# Patient Record
Sex: Female | Born: 1970 | Race: White | Hispanic: Yes | Marital: Single | Smoking: Light tobacco smoker
Health system: Western US, Academic
[De-identification: ages and names within clinical notes are randomized; demographics above are authoritative.]

## PROBLEM LIST (undated history)

## (undated) DIAGNOSIS — E119 Type 2 diabetes mellitus without complications: Secondary | ICD-10-CM

## (undated) DIAGNOSIS — F32A Depression, unspecified: Secondary | ICD-10-CM

## (undated) DIAGNOSIS — F419 Anxiety disorder, unspecified: Secondary | ICD-10-CM

## (undated) DIAGNOSIS — I872 Venous insufficiency (chronic) (peripheral): Secondary | ICD-10-CM

## (undated) DIAGNOSIS — E669 Obesity, unspecified: Secondary | ICD-10-CM

## (undated) DIAGNOSIS — I10 Essential (primary) hypertension: Secondary | ICD-10-CM

## (undated) DIAGNOSIS — Z794 Long term (current) use of insulin: Secondary | ICD-10-CM

## (undated) DIAGNOSIS — J45909 Unspecified asthma, uncomplicated: Secondary | ICD-10-CM

## (undated) DIAGNOSIS — K760 Fatty (change of) liver, not elsewhere classified: Secondary | ICD-10-CM

## (undated) DIAGNOSIS — Z0000 Encounter for general adult medical examination without abnormal findings: Principal | ICD-10-CM

## (undated) DIAGNOSIS — J309 Allergic rhinitis, unspecified: Secondary | ICD-10-CM

## (undated) DIAGNOSIS — L918 Other hypertrophic disorders of the skin: Secondary | ICD-10-CM

## (undated) DIAGNOSIS — F329 Major depressive disorder, single episode, unspecified: Secondary | ICD-10-CM

## (undated) DIAGNOSIS — N912 Amenorrhea, unspecified: Secondary | ICD-10-CM

## (undated) DIAGNOSIS — M79671 Pain in right foot: Secondary | ICD-10-CM

## (undated) DIAGNOSIS — Z716 Tobacco abuse counseling: Secondary | ICD-10-CM

## (undated) DIAGNOSIS — E785 Hyperlipidemia, unspecified: Secondary | ICD-10-CM

## (undated) HISTORY — DX: Other hypertrophic disorders of the skin: L91.8

## (undated) HISTORY — DX: Obesity, unspecified: E66.9

## (undated) HISTORY — DX: Tobacco abuse counseling: Z71.6

## (undated) HISTORY — DX: Type 2 diabetes mellitus without complications (CMS-HCC): E11.9

## (undated) HISTORY — DX: Unspecified asthma, uncomplicated: J45.909

## (undated) HISTORY — DX: Hyperlipidemia, unspecified: E78.5

## (undated) HISTORY — DX: Amenorrhea, unspecified: N91.2

## (undated) HISTORY — DX: Depression, unspecified: F32.A

## (undated) HISTORY — PX: NO PAST SURGERIES: SHX2092

## (undated) HISTORY — DX: Long term (current) use of insulin (CMS-HCC): Z79.4

## (undated) HISTORY — DX: Essential (primary) hypertension: I10

## (undated) HISTORY — DX: Allergic rhinitis, unspecified: J30.9

## (undated) HISTORY — DX: Major depressive disorder, single episode, unspecified: F32.9

## (undated) HISTORY — DX: Pain in right foot: M79.671

## (undated) HISTORY — DX: Encounter for general adult medical examination without abnormal findings: Z00.00

## (undated) HISTORY — DX: Fatty (change of) liver, not elsewhere classified: K76.0

## (undated) HISTORY — DX: Venous insufficiency (chronic) (peripheral): I87.2

## (undated) HISTORY — DX: Anxiety disorder, unspecified: F41.9

## (undated) MED ORDER — SIMVASTATIN 40 MG OR TABS
40.00 mg | ORAL_TABLET | Freq: Every evening | ORAL | 0 refills | Status: AC
Start: 2019-08-31 — End: ?

## (undated) MED ORDER — METFORMIN HCL 1000 MG OR TABS
1000.00 mg | ORAL_TABLET | Freq: Two times a day (BID) | ORAL | 0 refills | Status: AC
Start: 2019-04-28 — End: ?

## (undated) MED ORDER — SIMVASTATIN 40 MG OR TABS
40.00 mg | ORAL_TABLET | Freq: Every evening | ORAL | 0 refills | Status: AC
Start: 2019-04-28 — End: ?

## (undated) MED ORDER — ATENOLOL 25 MG OR TABS
ORAL_TABLET | ORAL | 0 refills | Status: AC
Start: 2018-09-12 — End: ?

## (undated) MED ORDER — METFORMIN HCL 1000 MG OR TABS
1000.00 mg | ORAL_TABLET | Freq: Two times a day (BID) | ORAL | 0 refills | Status: AC
Start: 2019-04-27 — End: ?

## (undated) MED ORDER — METFORMIN HCL 1000 MG OR TABS
ORAL_TABLET | ORAL | 0 refills | Status: AC
Start: 2018-09-12 — End: ?

## (undated) MED ORDER — LISINOPRIL-HYDROCHLOROTHIAZIDE 20-12.5 MG OR TABS
ORAL_TABLET | ORAL | 0 refills | Status: AC
Start: 2018-09-12 — End: ?

---

## 2004-09-27 ENCOUNTER — Other Ambulatory Visit (INDEPENDENT_AMBULATORY_CARE_PROVIDER_SITE_OTHER): Payer: Self-pay | Admitting: Emergency Medicine

## 2004-09-27 ENCOUNTER — Emergency Department (HOSPITAL_BASED_OUTPATIENT_CLINIC_OR_DEPARTMENT_OTHER): Admitting: Emergency Medicine

## 2004-09-27 LAB — BASIC METABOLIC PANEL, BLOOD
BUN: 7 mg/dL — ABNORMAL LOW (ref 8–18)
Bicarbonate: 28 meq/L (ref 24–31)
Calcium: 8.8 mg/dL (ref 8.8–10.3)
Chloride: 100 meq/L (ref 97–107)
Creatinine: 0.6 mg/dL (ref 0.5–1.5)
Glucose: 146 mg/dL — ABNORMAL HIGH (ref 65–110)
Potassium: 4.2 meq/L (ref 3.5–5.0)
Sodium: 138 meq/L (ref 135–145)

## 2004-11-26 NOTE — ED Notes (Addendum)
============================== ADMIT SUMMARY ==============================    RECEIVING NURSE -      ED NURSE -     +------------------------------- ALLERGIES -------------------------------+   NKDA    +-------------------------- ADMITTING DIAGNOSIS --------------------------+   htn   obesity   chronic ha    +--------------------------- ADMITTING SERVICE ---------------------------+    +------------------------ MOST RECENT VITAL SIGNS ------------------------+  BP - 129/74 PULSE - 82   RESPIRATIONS - 14 O2 SAT - 99   TEMPERATURE - MODE -   GCS TOTAL -   PAIN - PAIN QUALITY -   PAIN LOCATION -     +-------------------------------- FLUIDS ---------------------------------+  DATE TIME IV FLUID L/R LOCATION SIZE HUNG ABSORBED  ---------------------------------------------------------------------------     TOTAL IV: 0 ml    TOTAL OUTPUT: 0 ml TOTAL PO: 0 ml    +------------------------------ MEDICATIONS ------------------------------+  DATE TIME MEDICATION VERIFYING RN RN INIT  ---------------------------------------------------------------------------    10/29 1305 IBUPROFEN 600 Milligrams PO-(TAB) HDG    +------------------------------- LABS DONE -------------------------------+  ACT- MD MD RN AP +INITIALS+  IVE DATE TIME TIME TIME TREATMENT ORDERS MD RN AP   ---------------------------------------------------------------------------     10/29 1127 1128 1136 CHEM 7 + [place](BASIC META PANEL) LGL klc EXE   Specimen Type: Blood    EKG DONE - NO    +---------------------------- PROCEDURE NOTES ----------------------------+    +------------------------ OTHER NURSING PROCEDURES -----------------------+     +--------------------------- PSYCHOSOCIAL NEEDS --------------------------+  +------------------------- BARRIERS TO LEARNING --------------------------+    ASSESSMENT- Assessment Done with Findings of:      BARRIERS-    No Barriers  SUPPORT PERSON-      SPECIAL CONSIDERATIONS-      ============================== TRIAGE  RECORD ==============================    CHIEF COMPLAINT- High Blood Pressure    TIME OF ONSET- N/A : +-STANDING-+ +--SEATED--+  TRIAGE CATEGORY- 2 : BP PULSE BP PULSE   ROOM- 11A : N/A/N/A N/A 172/76 81   MODE OF ARRIVAL- Car :   IN CUSTODY- No : TEMP MODE O2SAT RESP LMP   PRIVATE MD- no : 97.4 Oral N/A 16 N/A       :   WORK RELATED INJURY- No : +--GCS--+ +--PUPILS--+  RETURN IN 72 HOURS- None : E V M TOT L R RESPONSE  TRIAGE NURSE- Veryl Speak : 4 5 6 15  X X N/A     IS THIS VISIT RELATED TO ASSAULT OR DOMESTIC VIOLENCE- no   PAIN TYPE- V NOW- 6 TOLERABLE AT- 4 QUALITY- Constant      PAIN LOCATION- head RADIATES TO- no   LATEX ALLERGY FORM- No LATEX ALLERGY- No TETANUS- N/A   IMMUNIZATION- N/A PED HEIGHT- N/A WEIGHT- N/A KG  ADDITIONAL FORMS- No   +------------------------- CARE PRIOR TO ARRIVAL -------------------------+   no  +------------------------------- ALLERGIES -------------------------------+   NKDA  +------------------------------ MEDICATIONS ------------------------------+   None  +-------------------------- PAST MEDICAL HISTORY -------------------------+   denies  +---------------------------- CURRENT HISTORY ----------------------------+   pt worried that BP is high, states she's under alot of stress, has gained   alot of wgt, ankles are swollen, pain in R neck and back of head. also has   R leg pain    =========================== REASSESSMENT VITALS ===========================   R T M I   E E O N   S M D O2 PUPILS +---GCS----+ I  DATE TIME BP HR P P E SAT L R E V M TOT POSITION T  ---------------------------------------------------------------------------    10/29 1031 172/76 81 16 97.4 O 4 5 6 15  Seated  KCS  10/29 1031 / Standing KCS  10/29 1200 129/74 82 14 99 Lying HDG     +-----------------PAIN-----------------+   T       Y N T       P O O      DATE TIME E W L LOCATION QUALITY RADIATES COMMENT      ---------------------------------------------------------------------------    10/29 1031 V 6 4  head Constant no Triage  10/29 1031 Triage  10/29 1200     ============================= NURSE DISCHARGE =============================    DISCHARGE NURSE- Heather Glass   DISPOSITION- Seen & Treated WITH- By Self   ACCOMPANIED BY- N/A   EQUIP W/TRANSPORT-    N/A   TIME OF DISPOSITION- 09/27/2004 1348 LEFT ED VIA- [facility]   TEANSFERRED TO- N/A REASON- N/A   ADMITTED TO- N/A ROOM- N/A   NURSE REPORT TO- N/A REPORT TIME- N/A   BELONGINGS- With Patient ENVELOPE NUMBER- N/A   CONDITION ON DISCHARGE- Stable   AFTERCARE PROVIDED WITH- Written and Verbal   WHAT AFTERCARE INSTRUCTIONS WERE GIVEN AND REVIEWED WITH PATIENT  AND/OR FAMILY?- (see EPIC instructions)   HA, HTN   IN WHAT LANGUAGE WERE THESE GIVEN?- English OTHER: N/A   TRANSLATED BY- N/A OTHER: N/A   GCS- E: 4 V: 5 M: 6 TOTAL: 15   PAIN LEVEL UPON DISPOSITION- 4 OUT OF 10  EXPECTED OUTCOMES MET- Yes   WHAT MEDS WERE PROVIDED FROM DISCHARGE PYXIS?-    N/A   RX TO BE FILLED FOR- N/A   DID THE PATIENT OR RESPONSIBLE CARE PROVIDER UNDERSTAND THE FOLLOW UP  RECOMMENDATION?- Yes DISPOSITION BY- RN        ============================== POINT OF CARE ==============================    OCCULT BLOOD STOOL RESULTS   Norm results neg.  DATE TIME RESULTS DONE BY CONTROL POSTIVE CONTROL NEGATIVE     URINE PREGNANCY TEST   Norm results for non pregnant females neg.  DATE TIME RESULTS DONE BY   10/29 1104 Neg KPR     URINE DIP   Norm results - All neg. with pH 4.6 to 8.0 and urobili 0.1 to 1.0   LEUKO NI- PRO- GLU- URO-   DATE TIME CYTE TRITE PH TEIN COSE KETONES BILI BILI BLOOD BY   10/29 1049 n n 5 n n n n n n KPR    FINGER STICK GLUCOSE   Norm results 60 to 110 mg/dl  DATE TIME RESULTS DONE BY     FINGER STICK HEMOGLOBIN   Norm results adult female 56 to 17 gm/dl  Norm results adult female 23 to 16 gm/dl  DATE TIME RESULTS DONE BY     ============================== MD NOTES H&P ===============================    TIME OF NOTE WRITTEN- N/A      CHIEF COMPLAINT- N/A        HISTORY  OF PRESENT ILLNESS  10/31 1018 Dorthy Cooler, MD     pt is a 33 y/o fem who has multiple c/o today. 1. has had wt   gain over past 6months 2.intermittent "stress headaches"   and 3. "i think my bp is high". pt says she's under alot of   stress, has gained alot of wgt this year, maybe ankles are   swollen, and notes pain in R lat neck, tight muscles,   radiating up to back of head. when asked to pinp0oint reason   for visit she sayd i just want my bp checked. ha not worst   of life, no n/v/photophobia. no stiff  neck, no blurred   vsion. does not have pmd      PAST MEDICAL/SURGICAL HISTORY  10/31 1018 Dorthy Cooler, MD     htn? mentioned by someone in past   obesity      FAMILY HISTORY- dad died of aneurysm in his 32s, htn   SOCIAL HISTORY- denies e/t/d abuse   OTHER- dad died of aneurysm in his 5s, htn   REVIEW OF SYSTEMS- All other systems reviewed and are negative.     PHYSICAL EXAM  10/31 1019 Dorthy Cooler, MD     mod systolic htn, o/w nl vs    10/31 1159 Dorthy Cooler, MD     normal O2 saturation on room air   Gen: obese, NAD, fluid speech   HEENT: ncat, perrla, anicteric sclerae, mmm   Neck: supple, no LAD, no midline ttp, JVP flat   Back: no CVAT, no midline ttp    Lungs: ctab, no w/r/r    CV: rrr, no m/r/g    Abdo: soft, ntnd, nabs, no masses    Ext: no edema, DP 2+ b/l   Neuro: a&o x4, cn 2-12 grossly intact. Nonfocal      IMPRESSION  10/31 1159 Dorthy Cooler, MD     mod htn possible, rec 3 measurements on 3 separated days at   same pmd/clinic   renal fxn nl   tension ha mild      MEDICAL DECISION MAKING  10/31 1159 Dorthy Cooler, MD     baseline labs wnl

## 2004-11-26 NOTE — ED Notes (Addendum)
==============================   ATTENDING NOTE =============================    10/29 1214 Danielle Glassman, MD Attending     pt seen with resident      cc. htn   hpi. pt presents for eval of persistently high BP. reports   mild right sided headache, back, and leg pain for quite some   time.    pmh/med/allergies confirmed   gen nad, obese   heent eomi perrl op clear   neck supple   resp ctab   cvs rrr, no mrg   abd +bs soft nt   ext no edema   impression   hypertensive   headache and back pain but no concerning features   no acute issues   plan   check chem and ua   pt will need out pt w/u for htn   says she will have insurance in several weeks and would   prefer to have full w/u when that occurs   f/u in clinic to have BP rechecked and perhaps get started   on meds

## 2004-11-26 NOTE — ED Notes (Addendum)
=================================   ORDERS ==================================    ACT- MD MD RN AP +INITIALS+  IVE DATE TIME TIME TIME TREATMENT ORDERS MD RN AP   ---------------------------------------------------------------------------     10/29 1042 1054 1044 POC Urine Dip LGL HDG [facility]   10/29 1043 1054 1044 pls repeat vs thanks LGL HDG [facility]   10/29 1043 1054 1044 POC UPT LGL HDG [facility]   10/29 1127 1128 1136 CHEM 7 + [place](BASIC META PANEL) LGL klc EXE   Specimen Type: Blood   10/29 1229 1346 1233 Discharge LGL HDG AXM   10/29 1302 1304 1303 IBUPROFEN 600 Milligrams PO-(TAB) LGL HDG RXG

## 2004-11-26 NOTE — ED Notes (Addendum)
rec f/u pmd/clinic (provided multiple numbers)   strict rted precuaitons for worsening sx    CASE PRESENTED TO- Danielle [name]     ============================= PHYSICIAN NOTES =============================      ============================= PROCEDURE NOTES =============================      ================================ LAB NOTES ================================    10/29 1228 Danielle Cooler, MD     nl chem      ================================= IMAGES ==================================      ============================== MD DISCHARGE ===============================    DISCHARGE PHYSICIAN- Danielle [name]   CHIEF COMPLAINT- N/A CASE PRESENTED TO- Danielle [name]   CONDITION OF DISCHARGE- Improving   WAS THIS VISIT FOR A WORK RELATED ILLNESS OR INJURY- No      PRIMARY CARE PHYSICIAN- NONE PER PT   HAS PCP BEEN CONTACTED- No H&P NOTE WAS DICTATED- No   DISCHARGE DIAGNOSIS-    htn obesity chronic ha     DISCHARGE INSTRUCTIONS    10/29 1334 Danielle Cooler, MD     PHYSICIAN- Danielle Glassman, MD Attending FOLLOW-UP (DAYS)- N/A    APPOINTMENT- No RETURN TO- N/A LANGUAGE- English    INSTRUCTIONS-   HYPERTENSION (HIGH BLOOD PRESSURE)    NONSPECIFIC HEADACHE    MEDICATIONS-   REFERRAL CLINICS-   [facility]    [facility] [place] [facility]    [facility] ([place]    [facility] [place] [facility]    [facility] [address]    REFERRAL PHYSICIANS-   ADDITIONAL INSTRUCTIONS-   please see a doctor next week for follow up   discuss with them your family history   if anything worsens, come right back   good luck      ============================= FOLLOW UP NOTES =============================    12/28 11/26/2004 1029 Danielle Patricia, RN     Reason for Addendum or Follow Up: Addendum Note   Action Taken: None required   Late Entry- 1130- chem 7 drawn and sent to the lab.

## 2018-05-18 ENCOUNTER — Telehealth (INDEPENDENT_AMBULATORY_CARE_PROVIDER_SITE_OTHER): Payer: Self-pay | Admitting: Family Practice

## 2018-05-18 NOTE — Telephone Encounter (Signed)
FYI    New Patient Pre-Visit Planning    Mychart offered: yes  [facility]  Establish Visit Date: 05/20/2018    Agenda Setting:    1) establish care   2) diabetes, cholestorol   3) medication refills    Preferred Pharmacy:   No Pharmacies Listed  [facility]   [address]   [place], [place] [id number]

## 2018-05-18 NOTE — Telephone Encounter (Signed)
Review.

## 2018-05-20 ENCOUNTER — Ambulatory Visit (INDEPENDENT_AMBULATORY_CARE_PROVIDER_SITE_OTHER): Payer: No Typology Code available for payment source | Admitting: Family Practice

## 2018-05-20 ENCOUNTER — Encounter (INDEPENDENT_AMBULATORY_CARE_PROVIDER_SITE_OTHER): Payer: Self-pay

## 2018-05-20 ENCOUNTER — Encounter (INDEPENDENT_AMBULATORY_CARE_PROVIDER_SITE_OTHER): Payer: Self-pay | Admitting: Family Practice

## 2018-05-20 ENCOUNTER — Other Ambulatory Visit: Payer: Self-pay

## 2018-05-20 ENCOUNTER — Other Ambulatory Visit (INDEPENDENT_AMBULATORY_CARE_PROVIDER_SITE_OTHER): Payer: No Typology Code available for payment source | Attending: Family Practice

## 2018-05-20 VITALS — BP 158/90 | HR 67 | Temp 97.9°F | Resp 17 | Ht 70.0 in | Wt 263.0 lb

## 2018-05-20 DIAGNOSIS — J309 Allergic rhinitis, unspecified: Secondary | ICD-10-CM | POA: Insufficient documentation

## 2018-05-20 DIAGNOSIS — E119 Type 2 diabetes mellitus without complications: Secondary | ICD-10-CM

## 2018-05-20 DIAGNOSIS — M79671 Pain in right foot: Secondary | ICD-10-CM

## 2018-05-20 DIAGNOSIS — Z1331 Encounter for screening for depression: Secondary | ICD-10-CM

## 2018-05-20 DIAGNOSIS — Z794 Long term (current) use of insulin: Secondary | ICD-10-CM

## 2018-05-20 DIAGNOSIS — E785 Hyperlipidemia, unspecified: Secondary | ICD-10-CM

## 2018-05-20 DIAGNOSIS — Z1389 Encounter for screening for other disorder: Secondary | ICD-10-CM

## 2018-05-20 DIAGNOSIS — Z0000 Encounter for general adult medical examination without abnormal findings: Secondary | ICD-10-CM

## 2018-05-20 DIAGNOSIS — F419 Anxiety disorder, unspecified: Secondary | ICD-10-CM

## 2018-05-20 DIAGNOSIS — K760 Fatty (change of) liver, not elsewhere classified: Secondary | ICD-10-CM

## 2018-05-20 DIAGNOSIS — L918 Other hypertrophic disorders of the skin: Secondary | ICD-10-CM | POA: Insufficient documentation

## 2018-05-20 DIAGNOSIS — N912 Amenorrhea, unspecified: Secondary | ICD-10-CM

## 2018-05-20 DIAGNOSIS — I10 Essential (primary) hypertension: Secondary | ICD-10-CM | POA: Insufficient documentation

## 2018-05-20 DIAGNOSIS — Z716 Tobacco abuse counseling: Secondary | ICD-10-CM

## 2018-05-20 DIAGNOSIS — Z6837 Body mass index (BMI) 37.0-37.9, adult: Secondary | ICD-10-CM

## 2018-05-20 DIAGNOSIS — J45909 Unspecified asthma, uncomplicated: Secondary | ICD-10-CM | POA: Insufficient documentation

## 2018-05-20 DIAGNOSIS — E669 Obesity, unspecified: Secondary | ICD-10-CM

## 2018-05-20 DIAGNOSIS — I872 Venous insufficiency (chronic) (peripheral): Secondary | ICD-10-CM

## 2018-05-20 DIAGNOSIS — F32A Depression, unspecified: Secondary | ICD-10-CM | POA: Insufficient documentation

## 2018-05-20 HISTORY — DX: Amenorrhea, unspecified: N91.2

## 2018-05-20 HISTORY — DX: Encounter for general adult medical examination without abnormal findings: Z00.00

## 2018-05-20 HISTORY — DX: Essential (primary) hypertension: I10

## 2018-05-20 HISTORY — DX: Pain in right foot: M79.671

## 2018-05-20 HISTORY — DX: Obesity, unspecified: E66.9

## 2018-05-20 HISTORY — DX: Fatty (change of) liver, not elsewhere classified: K76.0

## 2018-05-20 HISTORY — DX: Tobacco abuse counseling: Z71.6

## 2018-05-20 HISTORY — DX: Type 2 diabetes mellitus without complications: E11.9

## 2018-05-20 HISTORY — DX: Hyperlipidemia, unspecified: E78.5

## 2018-05-20 HISTORY — DX: Long term (current) use of insulin (CMS-HCC): Z79.4

## 2018-05-20 LAB — TSH, BLOOD: TSH: 2.73 u[IU]/mL (ref 0.27–4.20)

## 2018-05-20 LAB — CBC WITH DIFF, BLOOD
ANC-Automated: 4.3 10*3/uL (ref 1.6–7.0)
Abs Basophils: 0.1 10*3/uL (ref <0.1)
Abs Eosinophils: 0.5 10*3/uL (ref 0.1–0.5)
Abs Lymphs: 3.3 10*3/uL — ABNORMAL HIGH (ref 0.8–3.1)
Abs Monos: 0.8 10*3/uL (ref 0.2–0.8)
Basophils: 1 %
Eosinophils: 5 %
Hct: 42.4 % (ref 34.0–45.0)
Hgb: 14 gm/dL (ref 11.2–15.7)
Imm Gran Abs: 0.1 10*3/uL (ref <0.1)
Lymphocytes: 37 %
MCH: 31.8 pg (ref 26.0–32.0)
MCHC: 33 g/dL (ref 32.0–36.0)
MCV: 96.4 um3 — ABNORMAL HIGH (ref 79.0–95.0)
MPV: 10.5 fL (ref 9.4–12.4)
Monocytes: 9 %
Plt Count: 239 10*3/uL (ref 140–370)
RBC: 4.4 10*6/uL (ref 3.90–5.20)
RDW: 13.9 % (ref 12.0–14.0)
Segs: 48 %
WBC: 9 10*3/uL (ref 4.0–10.0)

## 2018-05-20 LAB — COMPREHENSIVE METABOLIC PANEL, BLOOD
ALT (SGPT): 46 U/L — ABNORMAL HIGH (ref 0–33)
AST (SGOT): 40 U/L — ABNORMAL HIGH (ref 0–32)
Albumin: 4 g/dL (ref 3.5–5.2)
Alkaline Phos: 66 U/L (ref 35–140)
Anion Gap: 12 mmol/L (ref 7–15)
BUN: 10 mg/dL (ref 6–20)
Bicarbonate: 26 mmol/L (ref 22–29)
Bilirubin, Tot: 0.36 mg/dL (ref <1.2)
Calcium: 9.3 mg/dL (ref 8.5–10.6)
Chloride: 104 mmol/L (ref 98–107)
Creatinine: 0.77 mg/dL (ref 0.51–0.95)
GFR: >60 mL/min
Glucose: 85 mg/dL (ref 70–99)
Potassium: 4.1 mmol/L (ref 3.5–5.1)
Sodium: 142 mmol/L (ref 136–145)
Total Protein: 7.3 g/dL (ref 6.0–8.0)

## 2018-05-20 LAB — LIPID(CHOL FRACT) PANEL, BLOOD
Cholesterol: 201 mg/dL (ref <200)
HDL-Cholesterol: 53 mg/dL
LDL-Chol (Calc): 127 mg/dL (ref <160)
Non-HDL Cholesterol: 148 mg/dL
Triglycerides: 107 mg/dL (ref 10–170)

## 2018-05-20 LAB — MAGNESIUM, BLOOD: Magnesium: 2 mg/dL (ref 1.6–2.6)

## 2018-05-20 LAB — GLYCOSYLATED HGB(A1C), BLOOD: Glyco Hgb (A1C): 6.8 % — ABNORMAL HIGH (ref 4.8–5.8)

## 2018-05-20 LAB — PROLACTIN, BLOOD: Prolactin: 10 ng/mL

## 2018-05-20 MED ORDER — METFORMIN HCL 500 MG OR TABS
ORAL_TABLET | ORAL | Status: DC
Start: 2018-01-10 — End: 2018-05-20

## 2018-05-20 MED ORDER — ONETOUCH DELICA LANCETS FINE MISC
Status: AC
Start: 2015-03-18 — End: 2019-03-17

## 2018-05-20 MED ORDER — FAMOTIDINE 40 MG OR TABS
ORAL_TABLET | ORAL | Status: DC
Start: 2018-01-11 — End: 2018-07-07

## 2018-05-20 MED ORDER — SIMVASTATIN 20 MG OR TABS
ORAL_TABLET | ORAL | Status: DC
Start: 2017-06-28 — End: 2018-05-20

## 2018-05-20 MED ORDER — LISINOPRIL-HYDROCHLOROTHIAZIDE 20-12.5 MG OR TABS
2.00 | ORAL_TABLET | Freq: Every day | ORAL | 0 refills | Status: DC
Start: 2018-05-20 — End: 2018-08-17

## 2018-05-20 MED ORDER — METOPROLOL SUCCINATE 25 MG OR TB24
25.00 mg | ORAL_TABLET | Freq: Every day | ORAL | 0 refills | Status: DC
Start: 2018-05-20 — End: 2018-08-15

## 2018-05-20 MED ORDER — BUPROPION XL (DAILY) 150 MG OR TB24
150.00 mg | ORAL_TABLET | Freq: Every day | ORAL | 8 refills | Status: DC
Start: 2018-05-20 — End: 2019-06-22

## 2018-05-20 MED ORDER — IBUPROFEN 400 MG OR TABS
ORAL_TABLET | ORAL | Status: AC
Start: 2018-01-29 — End: 2020-01-29

## 2018-05-20 MED ORDER — METFORMIN HCL 1000 MG OR TABS
1000.00 mg | ORAL_TABLET | Freq: Two times a day (BID) | ORAL | 0 refills | Status: DC
Start: 2018-05-20 — End: 2018-08-17

## 2018-05-20 MED ORDER — INSULIN ISOPHANE & REGULAR (70-30) 100 UNIT/ML SC SUSP
50.00 [IU] | Freq: Two times a day (BID) | SUBCUTANEOUS | 3 refills | Status: AC
Start: 2018-05-20 — End: 2018-08-18

## 2018-05-20 MED ORDER — BUPROPION XL (DAILY) 300 MG OR TB24
ORAL_TABLET | ORAL | Status: DC
Start: 2018-01-29 — End: 2018-05-20

## 2018-05-20 MED ORDER — SIMVASTATIN 20 MG OR TABS
20.00 mg | ORAL_TABLET | Freq: Every evening | ORAL | 0 refills | Status: DC
Start: 2018-05-20 — End: 2018-07-07

## 2018-05-20 MED ORDER — ATENOLOL 25 MG OR TABS
ORAL_TABLET | ORAL | Status: DC
Start: 2018-01-10 — End: 2018-05-20

## 2018-05-20 MED ORDER — GLUCOSE BLOOD VI STRP
ORAL_STRIP | Status: AC
Start: 2015-03-18 — End: 2019-03-17

## 2018-05-20 MED ORDER — INSULIN ISOPHANE & REGULAR (70-30) 100 UNIT/ML SC SUSP
SUBCUTANEOUS | Status: DC
Start: 2018-01-29 — End: 2018-05-20

## 2018-05-20 MED ORDER — LISINOPRIL-HYDROCHLOROTHIAZIDE 20-12.5 MG OR TABS
ORAL_TABLET | ORAL | Status: DC
Start: 2018-01-10 — End: 2018-05-20

## 2018-05-20 MED ORDER — FREESTYLE LIBRE 14 DAY SENSOR MISC
11 refills | Status: AC
Start: 2018-05-20 — End: ?

## 2018-05-20 MED ORDER — FREESTYLE LIBRE 14 DAY READER DEVI
0 refills | Status: AC
Start: 2018-05-20 — End: ?

## 2018-05-20 NOTE — Assessment & Plan Note (Signed)
Posterior. Stable but significant. No podiatry eval.She would be interested in surgery. Appears to be a cyst or callus. Refer podiatry for further eval. Strict return precautions given.

## 2018-05-20 NOTE — Assessment & Plan Note (Addendum)
Not at goal today.  Asymptomatic.   Ran out of lisinopril-hctz 20/12.5, 2 tabs daily 1 week ago  Has continued atenolol 25 mg daily  Does not check Home BPs.  Refill lisinopril-hctz  20/12.5, 2 tabs daily  Switch atenolol to metoprolol 25 mg XL daily  BP/HR log TID for at least 1 week and bring at f/u  Healthy diet  Encourage inc exc  Refer bariatric surgery dept

## 2018-05-20 NOTE — Assessment & Plan Note (Signed)
LP normal 09/2017  Ran out of simvastatin 2 weeks ago.  Refill simvastatin 20 mg PO qhs  Check FLP   Healthy diet  Encourage inc exc  Refer bariatric surgery dept

## 2018-05-20 NOTE — Patient Instructions (Signed)
Check your blood pressure and heart rate three times per day and bring the log to me in 2 weeks.

## 2018-05-20 NOTE — Interdisciplinary (Signed)
Blood drawn from right arm with 21 gauge needle. 4 tubes taken.   Patient identity authenticated by Pamala MIller.

## 2018-05-20 NOTE — Assessment & Plan Note (Signed)
2012 after a relationship ended.  Tried 450 mg XL-nightmares.  She has been on 300 mg XL since.   Wants to taper as feeling fine.   Has not helped with smoking cessation.   No prior hosp, SI/HI/attempts, e/d abuse.  Will try wellbutrin 150 mg XL daily.   Declines therapy.   Close f/u.

## 2018-05-20 NOTE — Progress Notes (Signed)
[facility], [facility] OUTPATIENT PROGRESS NOTE       PATIENT:  Danielle [name]  MRN:  [id number]  DOB:  1970/12/18  DATE OF SERVICE:  05/20/2018  PRIMARY CARE PROVIDER: Domingo Cocking  TODAY'S PROVIDER: Domingo Cocking, DO  CC   Establish Care; Medication Review; Diabetes; and Lipids      HPI   Danielle [name] is a 47 year old female who  has a past medical history of Allergic rhinitis, Amenorrhea (05/20/2018), Anxiety and depression, Asthma, Chronic venous insufficiency, Encounter for tobacco use cessation counseling (05/20/2018), Fatty liver (05/20/2018), Hyperlipidemia, unspecified hyperlipidemia type (05/20/2018), Hypertension, unspecified type (05/20/2018), Intractable right heel pain (05/20/2018), Obesity, unspecified classification, unspecified obesity type, unspecified whether serious comorbidity present (05/20/2018), Routine health maintenance (05/20/2018), Skin tags, multiple acquired, and Type 2 diabetes mellitus without complication, with long-term current use of insulin (CMS-HCC) (05/20/2018). and presents for the following:      [facility]-last visit approx Nov 2018   New  Establish care  While since she saw PCP    Highlighted paper with what she wants to discuss    Med refills for 90 days if possible    DM II since 2007   a1c 7 04/07/2017 (6.9 06/20/14); a1c 7.3 10/25/17 UACR neg   Metformin 500 BID-has 1 week left 04/2014: B12 529 05/2015   NPH 55 units qam, 55 qhs-adjusts according to goal: actually 40-50 BID -using novilin-today last dose   Monofilament testing always normal   DM eye screen-thinks 2018-eye exams have always been normal, due    No complications she is aware of     HTN  Elevated   Ran out of lisinopril-hctz 20/12.5 daily, 2 tabs daily; 1 week ago; atenolol 25 daily-has for another week   No home BPs  Vitals:    05/20/18 1440 05/20/18 1528   BP: (!) 160/91 158/90   BP Location: Left arm Left arm   BP Patient Position: Sitting Sitting   BP cuff size: Regular Large   Pulse: 67    Resp: 17     Temp: 97.9 F (36.6 C)    TempSrc: Oral    Weight: 119.3 kg (263 lb)    Height: 5\' 10"  (1.778 m)      HL-simva 20 -ran out 2 weeks     GERD- pepcid 40 bid, bentyl 20 tid-doesn't really use this, has some     Amenorrhea x 2 yrs  Prev advised overweight, uncontrolled dm  If moves around, feels sweaty but no hot flashes  Advised could take prog if wanted to but she never filled rx  No prior US   Sexually active with same partner for 1o yrs   G0     Dep/anxiety  wellbutrin 300 XL daily (450 caused nightmares)  Went through bad break up in 2012 and that is when she started it and hasn't gotten used to it  Feels fine now  Trying to wean off for a while  Has a bottle of 150s right now-not sure if expired-will order today   No prior hosp  No si/hi or attempts     Tob use  Started 10 yrs ago  1 pack per week  Always this way  Only when drinks alcohol  Hasn't tried gum/patches;   Quit line didn't help-few times     Body mass index is 37.74 kg/m.  Interested in surgery  Still interested in this  Diet-likes some junk foods, but not binging; biggest issue is  inactivity   exc-takes dog out twice per day around block  Mainly because CVI and has R heel bone spur that is hurting-stable, but causes a lot of pain-hasn't seen a foot doctor -would be interested in podiatry eval     Fatty liver, non alcoholic  GI f/u  US 2015: liver 21.59 cm, echogenicity increased, no hepatic masses, normal flow in main portal vein  Rest normal     RHM:  Pap 03/16/16  mammo today, no prior     04/07/17:  CBC w/ diff, BMP wnl  TSH 2.49  TC 164 Trig 138 HDL 57 LDL 79   UACR 23.4  ALT 63, AST 59  UA neg    [facility] [place] [facility]     ROS   (Check if Normal, or note + findings):   [x]  14-point ROS performed and is negative except as per HPI.    []  Not Obtainable:   []  Constit  []  Skin    []  Eyes  []  CV   []  MS    []  ENT  []  GI  []  Heme/Lymph     []  Resp  []  GU  []  Neuro    []  Imm/All   []  Endo  []  Psych     ALL   No Known Allergies  MEDS     Current  Outpatient Medications   Medication Sig Dispense Refill    buPROPion (WELLBUTRIN XL) 150 MG XL tablet Take 1 tablet (150 mg) by mouth daily. 90 tablet 8    Continuous Blood Gluc Receiver (CONTINUOUS BLOOD GLUCOSE, FREESTYLE LIBRE, 14 DAY READER) Use TID to read glucose topically. 1 Device 0    Continuous Blood Gluc Sensor (CONTINUOUS BLOOD GLUCOSE, FREESTYLE LIBRE 14 DAY, 14-DAY SENSOR) Insert subcutaneously every 14 days. 2 each 11    famotidine (PEPCID) 40 MG tablet Take 1 tablet by mouth 2 times a day      glucose blood (ONETOUCH VERIO) test strip by Does not apply route.      ibuprofen (MOTRIN) 400 MG tablet Take 1 to 2 tablets by mouth 3 times a day as needed for pain      insulin NPH-insulin regular (HUMULIN 70/30) (70-30) 100 UNIT/ML injection Inject 50 Units under the skin 2 times daily. 10 mL 3    lisinopril-hydrochlorothiazide (ZESTORETIC) 20-12.5 MG tablet Take 2 tablets by mouth daily. 180 tablet 0    metFORMIN (GLUCOPHAGE) 1000 MG tablet Take 1 tablet (1,000 mg) by mouth 2 times daily (with meals). 180 tablet 0    metoprolol succinate (TOPROL XL) 25 MG XL tablet Take 1 tablet (25 mg) by mouth daily. 90 tablet 0    ONETOUCH DELICA LANCETS FINE MISC by Does not apply route.      simvastatin (ZOCOR) 20 MG tablet Take 1 tablet (20 mg) by mouth every evening. 90 tablet 0     No current facility-administered medications for this visit.      PMH     Past Medical History:   Diagnosis Date    Allergic rhinitis     Amenorrhea 05/20/2018    Anxiety and depression     Asthma     Chronic venous insufficiency     Encounter for tobacco use cessation counseling 05/20/2018    Fatty liver 05/20/2018    Hyperlipidemia, unspecified hyperlipidemia type 05/20/2018    Hypertension, unspecified type 05/20/2018    Intractable right heel pain 05/20/2018    Obesity, unspecified classification, unspecified obesity type, unspecified whether serious comorbidity  present 05/20/2018    Routine health maintenance  05/20/2018    Skin tags, multiple acquired     Type 2 diabetes mellitus without complication, with long-term current use of insulin (CMS-HCC) 05/20/2018     PSxH     Past Surgical History:   Procedure Laterality Date    NO PAST SURGERIES       [facility] AND [facility]     Family History   Problem Relation Name Age of Onset    Diabetes Mother      No Known Problems Father      Diabetes Maternal Grandmother      No Known Problems Maternal Grandfather      No Known Problems Paternal Grandmother      No Known Problems Paternal Grandfather       Social History     Socioeconomic History    Marital status: Single     Spouse name: Not on file    Number of children: Not on file    Years of education: Not on file    Highest education level: Not on file   Occupational History    Not on file   Tobacco Use    Smoking status: Light Tobacco Smoker     Years: 10.00    Smokeless tobacco: Never Used    Tobacco comment: About 1 pack a week, only when drinking alcohol    Substance and Sexual Activity    Alcohol use: Yes     Frequency: 2-4 times a month    Drug use: Not Currently    Sexual activity: Yes     Partners: Male     Comment: same female partner x 10 yrs    Social Activities of Daily Living Present    Not on file   Social History Narrative    Not on file     PHYSICAL EXAM   Check if Normal, or note + findings  Vit BP 158/90 (BP Location: Left arm, BP Patient Position: Sitting, BP cuff size: Large)    Pulse 67    Temp 97.9 F (36.6 C) (Oral)    Resp 17    Ht 5\' 10"  (1.778 m)    Wt 119.3 kg (263 lb)    BMI 37.74 kg/m    Vitals:    05/20/18 1440 05/20/18 1528   BP: (!) 160/91 158/90   BP Location: Left arm Left arm   BP Patient Position: Sitting Sitting   BP cuff size: Regular Large   Pulse: 67    Resp: 17    Temp: 97.9 F (36.6 C)    TempSrc: Oral    Weight: 119.3 kg (263 lb)    Height: 5\' 10"  (1.778 m)       Gen [x]  NAD, obese  GI []  Abd  []  Rect []  HSM   Eyes [x]  Conj/Lids []  Pupils  []  Masses []  Guard/Rebnd   ENT []   Ears/otosc    []  Oroph   []  Nasal Muc  [x]  Hearing: grossly intact  GU:Fem  ZO:XWRU []  Ext   []  Vag              []  Pen  []  Scro    []  Cerv   []  Uter/Ad:    []  Deferred    []  Prost []  Deferred   Neck []  Inspect/Palp   []  Thyroid Neuro [x]  A&O [x]  CN2-12: grossly intact    Breast []  Inspect   []  Palpation  []  DTR [x]  Motor/Sen   Resp [  x] Effort [x]  Ascult []  Percuss MSK [x]  Gait   [x]  ROM  [x]  Tone  [x]  Bal [x]  Insp/palp: normal monofilament foot exam bilaterally; TTP over R posterior heel-may be callus vs cyst   []  Back   CV [x]  Auscultate [x]  Carotids       [x]  Edema Puls: [x]  Pedal []  Fem  Skin [x]  Inspect -multiple skin tags-neck    B foot/ankle hyperpigmentatio [x]  Palp   Lymph []  Neck  []  Axillary  []   Femoral Psych [x]  Insight/judg  [x]  Affect [x]  Cog     LABS/STUDIES     Orders Only on 09/27/2004   Component Date Value Ref Range Status    Glucose 09/27/2004 146* 65 - 110 mg/dL Final    BUN 16/08/9603 7* 8 - 18 mg/dL Final    Creatinine 54/07/8118 0.6  0.5 - 1.5 mg/dL Final    Sodium 14/78/2956 138  135 - 145 mEq/L Final    Potassium 09/27/2004 4.2  3.5 - 5.0 mEq/L Final    Chloride 09/27/2004 100  97 - 107 mEq/L Final    Bicarbonate 09/27/2004 28  24 - 31 mEq/L Final    Calcium 09/27/2004 8.8  8.8 - 10.3 mg/dL Final       Lab Results   Component Value Date    NA 138 09/27/2004    K 4.2 09/27/2004    CL 100 09/27/2004    BUN 7 (L) 09/27/2004    CREAT 0.6 09/27/2004        Pertinent [facility] Labs/imaging  personally reviewed by me     Assessment and Plan:   Danielle [name] is a very pleasant 47 year old female   has a past medical history of Allergic rhinitis, Amenorrhea (05/20/2018), Anxiety and depression, Asthma, Chronic venous insufficiency, Encounter for tobacco use cessation counseling (05/20/2018), Fatty liver (05/20/2018), Hyperlipidemia, unspecified hyperlipidemia type (05/20/2018), Hypertension, unspecified type (05/20/2018), Intractable right heel pain (05/20/2018), Obesity, unspecified  classification, unspecified obesity type, unspecified whether serious comorbidity present (05/20/2018), Routine health maintenance (05/20/2018), Skin tags, multiple acquired, and Type 2 diabetes mellitus without complication, with long-term current use of insulin (CMS-HCC) (05/20/2018)., new, establish care, discuss:       Problem List Items Addressed This Visit        Cardiovascular    Hyperlipidemia, unspecified hyperlipidemia type     LP normal 09/2017  Ran out of simvastatin 2 weeks ago.  Refill simvastatin 20 mg PO qhs  Check FLP   Healthy diet  Encourage inc exc  Refer bariatric surgery dept            Relevant Medications    simvastatin (ZOCOR) 20 MG tablet    Hypertension, unspecified type     Not at goal today.  Asymptomatic.   Ran out of lisinopril-hctz 20/12.5, 2 tabs daily 1 week ago  Has continued atenolol 25 mg daily  Does not check Home BPs.  Refill lisinopril-hctz  20/12.5, 2 tabs daily  Switch atenolol to metoprolol 25 mg XL daily  BP/HR log TID for at least 1 week and bring at f/u  Healthy diet  Encourage inc exc  Refer bariatric surgery dept             Relevant Medications    lisinopril-hydrochlorothiazide (ZESTORETIC) 20-12.5 MG tablet    metoprolol succinate (TOPROL XL) 25 MG XL tablet    Other Relevant Orders    Magnesium, Blood Green Plasma Separator Tube    Chronic venous insufficiency     B  feet  Discuss further at f/u               GI and Hepatology    Fatty liver     Non-alcoholic. Last [place] 2015: liver 21.59 cm, echogenicity increased, no hepatic masses, normal flow in main portal vein. LFTs: ALT 86/AST 80 10/25/17, normal CBC 10/25/17. Discuss hepatitis panel, ferritin at f/u. Order Abd [place].          Relevant Orders    US Abdomen Complete       Endocrine    Type 2 diabetes mellitus without complication, with long-term current use of insulin (CMS-HCC)     Dx 2007.  10/25/17 Labs:  A1C 7.3 (6.9 2015-->7 03/2017)  UACR neg   CBC wnl  BMP wnl  LFTs: ALT 86/AST 80  TSH wnl  LP wnl  B12 wnl  03/2015  Recheck CBC w/ diff, CMP, Mg, TSH, A1C, FLP, B12   Meds: Increase metformin from 500 BID to 1000 BID in attempts to decrease insulin use; refill NPH 50/50 given effective for her, she titrates b/n 40-50 BID based on BS  See HL, HTN  Order CGM given difficulty with accuchecks  Normal DM eye exam 2018, due-refer ophtho  Refer podiatry for Heel pain, +CVI; normal monofilament testing today  Vaccines- discuss at f/u   No prior complications   Close f/u in 2 weeks   Strict return precautions given.                 Relevant Medications    metFORMIN (GLUCOPHAGE) 1000 MG tablet    insulin NPH-insulin regular (HUMULIN 70/30) (70-30) 100 UNIT/ML injection    Continuous Blood Gluc Sensor (CONTINUOUS BLOOD GLUCOSE, FREESTYLE LIBRE 14 DAY, 14-DAY SENSOR)    Continuous Blood Gluc Receiver (CONTINUOUS BLOOD GLUCOSE, FREESTYLE LIBRE, 14 DAY READER)    Other Relevant Orders    B12 Binding Capability, Blood Red Plain    Ophthalmology Clinic       Other    Routine health maintenance - Primary     Pap: 03/16/16  Mammo-no prior, order today  Discuss rest at f/u          Relevant Orders    CBC w/ Diff Lavender    Comprehensive Metabolic Panel Green    Glycosylated Hgb(A1C), Blood Lavender    Lipid Panel Green Plasma Separator Tube    TSH, Blood - See Instructions    Screening Mammogram With Digital Breast Tomosynthesis - Bilateral    Anxiety and depression     2012 after a relationship ended.  Tried 450 mg XL-nightmares.  She has been on 300 mg XL since.   Wants to taper as feeling fine.   Has not helped with smoking cessation.   No prior hosp, SI/HI/attempts, e/d abuse.  Will try wellbutrin 150 mg XL daily.   Declines therapy.   Close f/u.            Relevant Medications    buPROPion (WELLBUTRIN XL) 150 MG XL tablet    Amenorrhea     LMP 2 yrs go.  Advised by previous physician that this is related to her obesity.  Offered progesterone but patient declined.  09/2017 TSH wnl.  Check prolactin.   Declines upreg.   Consider  estrogen, progesterone, LH, FSH in future.  No prior TVUS, order today.             Relevant Orders    Prolactin, Blood Green Plasma Separator Tube    US  Pelvic Transabd/Transvag Combination    Obesity, unspecified classification, unspecified obesity type, unspecified whether serious comorbidity present     Body mass index is 37.74 kg/m.  Healthy diet.  Poor exercise habits. She states related to significant L foot pain and B CVI.   Walks dogs twice per day, but around the block.  She has been interested in surgery.  Refer bariatric surgery.           Relevant Orders    Consult/Referral to Bariatric Surgery & Weight Management    Intractable right heel pain     Posterior. Stable but significant. No podiatry eval.She would be interested in surgery. Appears to be a cyst or callus. Refer podiatry for further eval. Strict return precautions given.           Relevant Orders    Orthopedics Clinic    Encounter for tobacco use cessation counseling     1 pack per week x 10 yrs.  Mostly with alc consumption.  wellbutrin ineffective.  Quite line ineffective.   Will try gum/patches.  CTM.              Other Visit Diagnoses     Screened negative for depression        Screened negative for drug use              #RTC: Return in about 2 weeks (around 06/03/2018) for f/u dm, htn .    The above recommendation were discussed with the patient.  The patient has all questions answered satisfactorily and is in agreement with this recommended plan of care.    I spent 40 min  with the patient this visit, more than 50% of which was spent counseling/coordinating care regarding listed above.     Landry Corporal, D.O., 05/20/2018 3:32 PM  Clinical Practice Organization  [facility], [facility]-Family Medicine  [address] [place], [place] [address]

## 2018-05-20 NOTE — Assessment & Plan Note (Addendum)
Dx 2007.  10/25/17 Labs:  A1C 7.3 (6.9 2015-->7 03/2017)  UACR neg   CBC wnl  BMP wnl  LFTs: ALT 86/AST 80  TSH wnl  LP wnl  B12 wnl 03/2015  Recheck CBC w/ diff, CMP, Mg, TSH, A1C, FLP, B12   Meds: Increase metformin from 500 BID to 1000 BID in attempts to decrease insulin use; refill NPH 50/50 given effective for her, she titrates b/n 40-50 BID based on BS  See HL, HTN  Order CGM given difficulty with accuchecks  Normal DM eye exam 2018, due-refer ophtho  Refer podiatry for Heel pain, +CVI; normal monofilament testing today  Vaccines- discuss at f/u   No prior complications   Close f/u in 2 weeks   Strict return precautions given.

## 2018-05-20 NOTE — Assessment & Plan Note (Signed)
Pap: 03/16/16  Mammo-no prior, order today  Discuss rest at f/u

## 2018-05-20 NOTE — Assessment & Plan Note (Signed)
LMP 2 yrs go.  Advised by previous physician that this is related to her obesity.  Offered progesterone but patient declined.  09/2017 TSH wnl.  Check prolactin.   Declines upreg.   Consider estrogen, progesterone, LH, FSH in future.  No prior TVUS, order today.

## 2018-05-20 NOTE — Assessment & Plan Note (Signed)
1 pack per week x 10 yrs.  Mostly with alc consumption.  wellbutrin ineffective.  Quite line ineffective.   Will try gum/patches.  CTM.

## 2018-05-20 NOTE — Assessment & Plan Note (Addendum)
Non-alcoholic. Last US 2015: liver 21.59 cm, echogenicity increased, no hepatic masses, normal flow in main portal vein. LFTs: ALT 86/AST 80 10/25/17, normal CBC 10/25/17. Discuss hepatitis panel, ferritin at f/u. Order Abd US.

## 2018-05-20 NOTE — Assessment & Plan Note (Addendum)
B feet  Discuss further at f/u

## 2018-05-20 NOTE — Assessment & Plan Note (Addendum)
Body mass index is 37.74 kg/m.  Healthy diet.  Poor exercise habits. She states related to significant L foot pain and B CVI.   Walks dogs twice per day, but around the block.  She has been interested in surgery.  Refer bariatric surgery.

## 2018-05-23 ENCOUNTER — Encounter (INDEPENDENT_AMBULATORY_CARE_PROVIDER_SITE_OTHER): Payer: Self-pay | Admitting: Family Practice

## 2018-05-23 LAB — B12 BINDING CAPABILITY, BLOOD: B12 Binding Capacity: 1870 pg/mL (ref 800–2600)

## 2018-06-03 ENCOUNTER — Encounter (INDEPENDENT_AMBULATORY_CARE_PROVIDER_SITE_OTHER): Payer: No Typology Code available for payment source | Admitting: Family Practice

## 2018-06-09 ENCOUNTER — Encounter (INDEPENDENT_AMBULATORY_CARE_PROVIDER_SITE_OTHER): Payer: No Typology Code available for payment source | Admitting: Family Practice

## 2018-07-07 ENCOUNTER — Encounter (INDEPENDENT_AMBULATORY_CARE_PROVIDER_SITE_OTHER): Payer: Self-pay | Admitting: Hospital

## 2018-07-07 DIAGNOSIS — K219 Gastro-esophageal reflux disease without esophagitis: Secondary | ICD-10-CM

## 2018-07-07 DIAGNOSIS — E785 Hyperlipidemia, unspecified: Secondary | ICD-10-CM

## 2018-07-07 MED ORDER — FAMOTIDINE 40 MG OR TABS
40.0000 mg | ORAL_TABLET | Freq: Two times a day (BID) | ORAL | 8 refills | Status: AC
Start: 2018-07-07 — End: 2020-07-06

## 2018-07-07 MED ORDER — SIMVASTATIN 40 MG OR TABS
40.0000 mg | ORAL_TABLET | Freq: Every evening | ORAL | 0 refills | Status: DC
Start: 2018-07-07 — End: 2018-10-19

## 2018-07-07 NOTE — Telephone Encounter (Signed)
From: Danielle [name]  To: Borad, Amruti D, DO  Sent: 07/07/2018 1:45 PM PDT  Subject: 20-Other    Hi Dr., I ran out of Simvastatin because per your advice I have been taking two per day instead of one, as well as the fish oil capsules that you suggested to bring down my cholesterol. I just requested a refill but I believe you will have to approve it first. Would you be able to double the dosage or the quantities so that I can get 90 days worth? Also I need Famotidine twice a day, [facility] but I am going to run out in a bit. I think I was taking 40 mg twice a day with my last rx. Thanks!

## 2018-07-08 ENCOUNTER — Telehealth (INDEPENDENT_AMBULATORY_CARE_PROVIDER_SITE_OTHER): Payer: Self-pay

## 2018-07-08 NOTE — Telephone Encounter (Signed)
Outgoing call to the patient, left a message to please call [phone].    Need to verify with the patient is interested in bariatric surgery. If so, please inform the Bariatric Coordinator to please send 3 videos which are required to be completed prior to scheduling her initial visits to meet with our BMI Team.

## 2018-07-18 ENCOUNTER — Encounter: Payer: Self-pay | Admitting: Family Practice

## 2018-08-03 NOTE — Telephone Encounter (Signed)
Outgoing call to the patient, left a message to please call [phone].    Received an internal referral to the bariatric clinic.      Need to confirm if patient is interested in bariatric surgery in the future.  If so, please forward to the Bariatric Coordinator.      The patient is required to watch the 3 Emmi Videos prior to scheduling his initial consultation appointments.

## 2018-08-15 ENCOUNTER — Encounter (INDEPENDENT_AMBULATORY_CARE_PROVIDER_SITE_OTHER): Payer: Self-pay | Admitting: Family Practice

## 2018-08-15 ENCOUNTER — Encounter (INDEPENDENT_AMBULATORY_CARE_PROVIDER_SITE_OTHER): Payer: Self-pay | Admitting: Hospital

## 2018-08-15 DIAGNOSIS — I10 Essential (primary) hypertension: Secondary | ICD-10-CM

## 2018-08-15 MED ORDER — ATENOLOL 25 MG OR TABS
25.0000 mg | ORAL_TABLET | Freq: Every day | ORAL | 0 refills | Status: DC
Start: 2018-08-15 — End: 2018-09-12

## 2018-08-15 MED ORDER — ATENOLOL 25 MG OR TABS
25.0000 mg | ORAL_TABLET | Freq: Every day | ORAL | 0 refills | Status: DC
Start: 2018-08-15 — End: 2018-08-15

## 2018-08-15 NOTE — Telephone Encounter (Signed)
From: Danielle [name]  To: Borad, Amruti D, DO  Sent: 08/15/2018 2:35 PM PDT  Subject: ZO:[email or web address]:atenolol    Hi Dr it's supposed to be a 90 day RX [facility] I got an email that the 30 day supply is ready and [facility]  ----- Message -----  From: [name], DO  Sent: 08/15/2018 2:04 PM PDT  To: Danielle Ranristina Maria Mcglown  Subject: atenolol  Gardenia PhlegmHi Danielle [name],    I just sent the atenolol prescription but realized [facility] [address].  And I can find [address]    Please send the full address or phone number of the pharmacy will help too.    Also since we are going back to atenolol, please send me a blood pressure and heart rate log three times per day for 7 days after you switch to the antenolol so we know your blood pressures are well controlled.    Thank you.     Kind regards,  Landry Corporalmruti Borad, D.O., 08/15/2018 2:04 PM  Clinical Practice Organization  [facility], [facility]  [id number]  [place], [place] [address]  Phone:[phone]  Fax: [phone]

## 2018-08-16 ENCOUNTER — Encounter (INDEPENDENT_AMBULATORY_CARE_PROVIDER_SITE_OTHER): Payer: Self-pay | Admitting: Hospital

## 2018-08-17 ENCOUNTER — Encounter (INDEPENDENT_AMBULATORY_CARE_PROVIDER_SITE_OTHER): Payer: Self-pay | Admitting: Family Practice

## 2018-08-17 DIAGNOSIS — I10 Essential (primary) hypertension: Secondary | ICD-10-CM

## 2018-08-17 DIAGNOSIS — Z794 Long term (current) use of insulin: Secondary | ICD-10-CM

## 2018-08-17 MED ORDER — LISINOPRIL-HYDROCHLOROTHIAZIDE 20-12.5 MG OR TABS
2.0000 | ORAL_TABLET | Freq: Every day | ORAL | 0 refills | Status: DC
Start: 2018-08-17 — End: 2018-09-12

## 2018-08-17 MED ORDER — METFORMIN HCL 1000 MG OR TABS
1000.0000 mg | ORAL_TABLET | Freq: Two times a day (BID) | ORAL | 0 refills | Status: DC
Start: 2018-08-17 — End: 2018-09-12

## 2018-08-17 NOTE — Telephone Encounter (Signed)
From: Danielle [name]  To: [name], Danielle D, DO  Sent: 08/16/2018 4:46 AM PDT  Subject: ZO:XWRUEAVWRE:atenolol    Hello sorry to keep bothering you but I had also requested refills for Glucophage 1000mg  qty180 Lisinopril 20-12.5 qty 180 and they also needed authorization. Did [facility] send you those? Their app is not very user friendly. [facility] [address] thank you!  ----- Message -----  From: [name], DO  Sent: 08/15/2018 4:04 PM PDT  To: Danielle [name]  Subject: UJ:[email or web address]:atenolol    Will send the prescription now.    Thank you.     Kind regards,  Landry Corporalmruti [name], D.O., 08/15/2018 4:04 PM  Clinical Practice Organization  [facility], [facility]  [id number]  [place], [place] [address]  Phone:[phone]  Fax: [phone]      ----- Message -----    From: Danielle [name]   Sent: 08/15/2018 2:17 PM PDT   To: [name], DO  Subject: UV:[email or web address]:atenolol    [facility]   [address]   [place]   [id number]  [phone]    And ok I will send you my numbers for your review of my blood pressure  Thank you  [name]  ----- Message -----  From: [name], DO  Sent: 08/15/2018 2:04 PM PDT  To: Danielle [name]  Subject: atenolol  Hi [name],    I just sent the atenolol prescription but realized [facility] [address]  And I can find [address]    Please send the full address or phone number of the pharmacy will help too.    Also since we are going back to atenolol, please send me a blood pressure and heart rate log three times per day for 7 days after you switch to the antenolol so we know your blood pressures are well controlled.    Thank you.     Kind regards,  Landry Corporalmruti [name], D.O., 08/15/2018 2:04 PM  Clinical Practice Organization  [facility], [facility]  [id number]  , [place] [id number]  Phone:[phone]  Fax: [phone]

## 2018-08-29 ENCOUNTER — Encounter (INDEPENDENT_AMBULATORY_CARE_PROVIDER_SITE_OTHER): Payer: Self-pay | Admitting: Family Practice

## 2018-09-10 ENCOUNTER — Other Ambulatory Visit (INDEPENDENT_AMBULATORY_CARE_PROVIDER_SITE_OTHER): Payer: Self-pay | Admitting: Family Practice

## 2018-09-10 DIAGNOSIS — Z794 Long term (current) use of insulin: Secondary | ICD-10-CM

## 2018-09-10 DIAGNOSIS — I10 Essential (primary) hypertension: Principal | ICD-10-CM

## 2018-09-10 DIAGNOSIS — E119 Type 2 diabetes mellitus without complications: Secondary | ICD-10-CM

## 2018-09-12 MED ORDER — ATENOLOL 25 MG OR TABS
25.0000 mg | ORAL_TABLET | Freq: Every day | ORAL | 0 refills | Status: DC
Start: 2018-09-12 — End: 2018-10-19

## 2018-09-12 MED ORDER — METFORMIN HCL 1000 MG OR TABS
1000.0000 mg | ORAL_TABLET | Freq: Two times a day (BID) | ORAL | 0 refills | Status: DC
Start: 2018-09-12 — End: 2018-10-19

## 2018-09-12 MED ORDER — LISINOPRIL-HYDROCHLOROTHIAZIDE 20-12.5 MG OR TABS
2.0000 | ORAL_TABLET | Freq: Every day | ORAL | 0 refills | Status: DC
Start: 2018-09-12 — End: 2018-10-19

## 2018-09-12 NOTE — Telephone Encounter (Signed)
Needs an appointment.  Cancelled multiple follow ups since 04/2018.    Thank you.     Kind regards,  Landry Corporal, D.O., 09/12/2018 1:26 PM  Clinical Practice Organization  [facility], [facility]  [address] [place], [place] [address]  Phone:[phone]  Fax: [phone]

## 2018-09-12 NOTE — Telephone Encounter (Signed)
Refill request faxed from: MyChart    Medication requested:   Requested Prescriptions     Pending Prescriptions Disp Refills   . lisinopril-hydrochlorothiazide (ZESTORETIC) 20-12.5 MG tablet [Pharmacy Med Name: LISINOPRIL/HCTZ 20-12.5MG  TAB] 60 tablet 0     Sig: TAKE 2 TABLETS BY MOUTH ONCE DAILY   . metFORMIN (GLUCOPHAGE) 1000 MG tablet [Pharmacy Med Name: METFORMIN 1000MG  TAB] 60 tablet 0     Sig: TAKE 1 TABLET BY MOUTH TWICE DAILY WITH MEALS   . atenolol (TENORMIN) 25 MG tablet [Pharmacy Med Name: ATENOLOL 25MG        TAB] 30 tablet 0     Sig: TAKE 1 TABLET BY MOUTH ONCE DAILY         Patient is requesting refill of medication, please see orders for preloaded prescription refill.    Last visit with this MD:    05/20/2018  Next appointment with this MD:   Visit date not found    Last office visit:     05/20/2018  Next office visit:     Visit date not found    List of HM items due or overdue:  Health Maintenance Due   Topic Date Due   . BREAST CANCER SCREENING  03-18-73   . DM_RETINA EXAM  03-15-1973   . DM_ FOOT EXAM  10-19-1973   . DM_URINE MICROALBUMIN  02-23-1973   . PHQ9 Depression Monitoring doc flowsheet  10/14/1991   . INFLUENZA VACCINE  06/30/2018       Last 3 blood pressures:  Blood Pressure   05/20/18 158/90       Last basic metabolic panel:  Lab Results   Component Value Date    NA 142 05/20/2018    K 4.1 05/20/2018    CL 104 05/20/2018    BICARB 26 05/20/2018    BUN 10 05/20/2018    CREAT 0.77 05/20/2018    GLU 85 05/20/2018    [place] 9.3 05/20/2018       Last urinalysis:  No results found for: COLORUA, APPEARUA, GLUCOSEUA, BILIUA, KETONEUA, SGUA, BLOODUA, PHUA, PROTEINUA, UROBILUA, NITRITEUA, LEUKESTUA, WBCUA, RBCUA, EPITHCELLSUA, HYALINEUA, CRYSTALSUA, COMMENTSUA

## 2018-09-12 NOTE — Telephone Encounter (Signed)
Per PCP's request all rx's have been pended for 30 day supply.

## 2018-09-12 NOTE — Telephone Encounter (Signed)
Forwarding MyChart message to PCP.

## 2018-10-05 ENCOUNTER — Encounter (INDEPENDENT_AMBULATORY_CARE_PROVIDER_SITE_OTHER): Payer: No Typology Code available for payment source | Admitting: Family Practice

## 2018-10-07 ENCOUNTER — Encounter (INDEPENDENT_AMBULATORY_CARE_PROVIDER_SITE_OTHER): Payer: Self-pay | Admitting: Family Practice

## 2018-10-12 ENCOUNTER — Encounter (INDEPENDENT_AMBULATORY_CARE_PROVIDER_SITE_OTHER): Payer: Self-pay | Admitting: Family Practice

## 2018-10-19 ENCOUNTER — Encounter (INDEPENDENT_AMBULATORY_CARE_PROVIDER_SITE_OTHER): Payer: Self-pay

## 2018-10-19 ENCOUNTER — Ambulatory Visit (INDEPENDENT_AMBULATORY_CARE_PROVIDER_SITE_OTHER): Payer: No Typology Code available for payment source | Admitting: Family Practice

## 2018-10-19 ENCOUNTER — Encounter (INDEPENDENT_AMBULATORY_CARE_PROVIDER_SITE_OTHER): Payer: Self-pay | Admitting: Family Practice

## 2018-10-19 ENCOUNTER — Other Ambulatory Visit: Payer: Self-pay

## 2018-10-19 VITALS — BP 117/69 | HR 77 | Temp 97.7°F | Ht 70.0 in | Wt 347.0 lb

## 2018-10-19 DIAGNOSIS — N644 Mastodynia: Secondary | ICD-10-CM

## 2018-10-19 DIAGNOSIS — Z794 Long term (current) use of insulin: Secondary | ICD-10-CM

## 2018-10-19 DIAGNOSIS — E669 Obesity, unspecified: Secondary | ICD-10-CM

## 2018-10-19 DIAGNOSIS — I10 Essential (primary) hypertension: Principal | ICD-10-CM

## 2018-10-19 DIAGNOSIS — K760 Fatty (change of) liver, not elsewhere classified: Secondary | ICD-10-CM

## 2018-10-19 DIAGNOSIS — E785 Hyperlipidemia, unspecified: Secondary | ICD-10-CM

## 2018-10-19 DIAGNOSIS — Z23 Encounter for immunization: Secondary | ICD-10-CM

## 2018-10-19 DIAGNOSIS — E119 Type 2 diabetes mellitus without complications: Secondary | ICD-10-CM

## 2018-10-19 DIAGNOSIS — N912 Amenorrhea, unspecified: Secondary | ICD-10-CM

## 2018-10-19 HISTORY — DX: Mastodynia: N64.4

## 2018-10-19 MED ORDER — LISINOPRIL-HYDROCHLOROTHIAZIDE 20-12.5 MG OR TABS
2.00 | ORAL_TABLET | Freq: Every day | ORAL | 0 refills | Status: DC
Start: 2018-10-19 — End: 2018-11-14

## 2018-10-19 MED ORDER — SIMVASTATIN 40 MG OR TABS
40.00 mg | ORAL_TABLET | Freq: Every evening | ORAL | 0 refills | Status: DC
Start: 2018-10-19 — End: 2018-11-18

## 2018-10-19 MED ORDER — METFORMIN HCL 1000 MG OR TABS
1000.00 mg | ORAL_TABLET | Freq: Two times a day (BID) | ORAL | 0 refills | Status: DC
Start: 2018-10-19 — End: 2018-11-14

## 2018-10-19 MED ORDER — ATENOLOL 25 MG OR TABS
25.00 mg | ORAL_TABLET | Freq: Every day | ORAL | 0 refills | Status: DC
Start: 2018-10-19 — End: 2018-11-14

## 2018-10-19 NOTE — Assessment & Plan Note (Addendum)
At goal. Asymptomatic. Refill lisinopril-hctz 20/12.5 2 tabs daily, atenolol 25 daily. Check bmp. Strict return precautions given.

## 2018-10-19 NOTE — Assessment & Plan Note (Signed)
Normal 04/2018. Refill simva 40 qhs. Cont fish oil. Recheck today.

## 2018-10-19 NOTE — Assessment & Plan Note (Addendum)
A1C 6.8 04/2018.  We had increased metformin to 1000 BID, and in turn pt decreased her novolin 70/30 from 50 units BID to 40 units BID on her own.  Home BS unknown.  Repeat A1C today.  Due for UACR.  Never schedule ophtho exam, print referral.   Never saw podiatry for foot pain, but normal monofilament exam 04/2018. Print referral.   Strict return precautions given.

## 2018-10-19 NOTE — Assessment & Plan Note (Addendum)
Transaminitis 04/2018. Never completed abd US. Repeat LFTs +  Will sched US. She has had other fatty liver work up that was neg. She is on metformin + statin. Asymptomatic. Strict return precautions given.

## 2018-10-19 NOTE — Assessment & Plan Note (Signed)
X 2 weeks.  May be breast or just lateral to breast and just distal to axilla.  Significant ttp on exam but no masses, nipple discharge, rash appreciated in area of tenderness.  Never completed screening mammo when ordered in June 2019.  She does lift her 316 mos old grandson who continues to gain weight.    amenorrhea thus no menses in last 1-2 yrs.  May just be muscle strain.   -L breast/axillary US  -For now, schedule screening mammo, may need to change to dx  -Wear looser bra  -Other supportive care for pain management   -gyn referral for amenorrhea   -Strict return precautions given.

## 2018-10-19 NOTE — Assessment & Plan Note (Signed)
LMP 2 yrs go.  Advised by previous physician that this is related to her obesity.  Offered progesterone but patient declined, now considering.  04/2018 TSH, prolactin normal.   Declines upreg.   Consider estrogen, progesterone, LH, FSH in future.  No prior TVUS, ordered 04/2018, never completed, advised to schedule.  Refer gyn.

## 2018-10-19 NOTE — Assessment & Plan Note (Signed)
Lost 16 lbs since June 2019 but due to decreased appetite from stress over last 1-2 mos. She also had abd pain with all foods that she also thought was stress related. Has resolved. Eval further if recurs. Never saw bariatric surgery, print referral. Exc minimal- dog walking. Still has foot pain and never saw podiatry. Will print referral.   CTM.

## 2018-10-19 NOTE — Progress Notes (Signed)
HEALTH, [facility] OUTPATIENT PROGRESS NOTE       PATIENT:  Danielle [name]  MRN:  [id number]  DOB:  11-01-1971  DATE OF SERVICE:  05/20/2018  PRIMARY CARE PROVIDER: Domingo Cocking  TODAY'S PROVIDER: Domingo Cocking, DO  CC   Medication Refill      HPI   Danielle [name] is a 47 year old female who  has a past medical history of Allergic rhinitis, Amenorrhea (05/20/2018), Anxiety and depression, Asthma, Chronic venous insufficiency, Encounter for tobacco use cessation counseling (05/20/2018), Fatty liver (05/20/2018), Hyperlipidemia, unspecified hyperlipidemia type (05/20/2018), Hypertension, unspecified type (05/20/2018), Intractable right heel pain (05/20/2018), Obesity, unspecified classification, unspecified obesity type, unspecified whether serious comorbidity present (05/20/2018), Routine health maintenance (05/20/2018), Skin tags, multiple acquired, and Type 2 diabetes mellitus without complication, with long-term current use of insulin (CMS-HCC) (05/20/2018). and presents for the following:    Last visit was June     Needs med refills       Left side pain near breast; left handed  Dull pain  Not getting worse  Doesn't need pain meds  No prior such pain  Does wear tight bra  Has grandson 40 mos old, carrying a lot and getting heavier but uses r arm more  No overuse   Hasn't done her mammo    Not menstruating, last 1-2 yrs ago  No nipple discharge      htn  Normal today  Doesn't check at home  Feels fine  linisopril-hctz 20/12.5  2 tabs daily  Atenolol 25 daily  Lost 16 lbs  Since June- last 1-2 mos: change in diet but thinks releated to less of appetitie from overworked , had abd pain with food but all resolved   bariatric surgery eval still pending  No sig inc in exc     HL-simva 40, fish oil - chol panel was normal 04/2018; today     DM  Novolin 70/30 40 units BID (instead of 50 bid b/c inc in metformin) as on metformin 1000 BID  a1c 6.8 04/2018  UACR today  Pending ophto eval    transaminitis  Known fatty  liver  Still pending abd [place]     Hasn't gone to ortho for foot pain    Print all referrals     RHM:  Flu shot today    ROS   (Check if Normal, or note + findings):   [x]  14-point ROS performed and is negative except as per HPI.    []  Not Obtainable:   []  Constit  []  Skin    []  Eyes  []  CV   []  MS    []  ENT  []  GI  []  Heme/Lymph     []  Resp  []  GU  []  Neuro    []  Imm/All   []  Endo  []  Psych     ALL   No Known Allergies  MEDS     Current Outpatient Medications   Medication Sig Dispense Refill   . atenolol (TENORMIN) 25 MG tablet Take 1 tablet (25 mg) by mouth daily. 30 tablet 0   . buPROPion (WELLBUTRIN XL) 150 MG XL tablet Take 1 tablet (150 mg) by mouth daily. 90 tablet 8   . Continuous Blood Gluc Receiver (CONTINUOUS BLOOD GLUCOSE, FREESTYLE LIBRE, 14 DAY READER) Use TID to read glucose topically. 1 Device 0   . Continuous Blood Gluc Sensor (CONTINUOUS BLOOD GLUCOSE, FREESTYLE LIBRE 14 DAY, 14-DAY SENSOR) Insert subcutaneously every 14 days. 2 each 11   .  famotidine (PEPCID) 40 MG tablet Take 1 tablet (40 mg) by mouth 2 times daily. 180 tablet 8   . glucose blood (ONETOUCH VERIO) test strip by Does not apply route.     Marland Kitchen ibuprofen (MOTRIN) 400 MG tablet Take 1 to 2 tablets by mouth 3 times a day as needed for pain     . insulin NPH-insulin regular (HUMULIN 70/30) (70-30) 100 UNIT/ML injection Inject 50 Units under the skin 2 times daily. 10 mL 3   . lisinopril-hydrochlorothiazide (ZESTORETIC) 20-12.5 MG tablet Take 2 tablets by mouth daily. 60 tablet 0   . metFORMIN (GLUCOPHAGE) 1000 MG tablet Take 1 tablet (1,000 mg) by mouth 2 times daily (with meals). 60 tablet 0   . ONETOUCH DELICA LANCETS FINE MISC by Does not apply route.     . simvastatin (ZOCOR) 40 MG tablet Take 1 tablet (40 mg) by mouth every evening. 30 tablet 0     No current facility-administered medications for this visit.      PMH     Past Medical History:   Diagnosis Date   . Allergic rhinitis    . Amenorrhea 05/20/2018   . Anxiety and depression    .  Asthma    . Chronic venous insufficiency    . Encounter for tobacco use cessation counseling 05/20/2018   . Fatty liver 05/20/2018   . Hyperlipidemia, unspecified hyperlipidemia type 05/20/2018   . Hypertension, unspecified type 05/20/2018   . Intractable right heel pain 05/20/2018   . Obesity, unspecified classification, unspecified obesity type, unspecified whether serious comorbidity present 05/20/2018   . Routine health maintenance 05/20/2018   . Skin tags, multiple acquired    . Type 2 diabetes mellitus without complication, with long-term current use of insulin (CMS-HCC) 05/20/2018     PSxH     Past Surgical History:   Procedure Laterality Date   . NO PAST SURGERIES       [facility]     Family History   Problem Relation Name Age of Onset   . Diabetes Mother     . No Known Problems Father     . Diabetes Maternal Grandmother     . No Known Problems Maternal Grandfather     . No Known Problems Paternal Grandmother     . No Known Problems Paternal Grandfather       Social History     Socioeconomic History   . Marital status: Single     Spouse name: Not on file   . Number of children: Not on file   . Years of education: Not on file   . Highest education level: Not on file   Occupational History   . Not on file   Tobacco Use   . Smoking status: Light Tobacco Smoker     Years: 10.00   . Smokeless tobacco: Never Used   . Tobacco comment: About 1 pack a week, only when drinking alcohol    Substance and Sexual Activity   . Alcohol use: Yes     Frequency: 2-4 times a month   . Drug use: Not Currently   . Sexual activity: Yes     Partners: Male     Comment: same female partner x 10 yrs    Social Activities of Daily Living Present   . Not on file   Social History Narrative   . Not on file     PHYSICAL EXAM   Check if Normal, or note + findings  Vit BP 117/69 (BP Location: Right arm, BP Patient Position: Sitting, BP cuff size: Large)   Pulse 77   Temp 97.7 F (36.5 C) (Oral)   Ht 5\' 10"  (1.778 m)   Wt 157.4 kg (347 lb)   LMP   (LMP Unknown)   SpO2 100%   Breastfeeding No   BMI 49.79 kg/m    Vitals:    10/19/18 1131   BP: 117/69   BP Location: Right arm   BP Patient Position: Sitting   BP cuff size: Large   Pulse: 77   Temp: 97.7 F (36.5 C)   TempSrc: Oral   SpO2: 100%   Weight: 157.4 kg (347 lb)   Height: 5\' 10"  (1.778 m)      Gen [x]  NAD, obese  GI [x]  Abd  []  Rect [x]  HSM-obesity thus difficult to appreciate    Eyes [x]  Conj/Lids []  Pupils  [x]  Masses [x]  Guard/Rebnd   ENT []  Ears/otosc    []  Oroph   []  Nasal Muc  [x]  Hearing: grossly intact  GU:Fem  ZO:XWRU []  Ext   []  Vag              []  Pen  []  Scro    []  Cerv   []  Uter/Ad:    []  Deferred    []  Prost []  Deferred   Neck []  Inspect/Palp   []  Thyroid Neuro [x]  A&O [x]  CN2-12: grossly intact    Breast [x]  Inspect   [x]  Palpation: Sig ttp just lateral to the left breast; no erythema, inc warmth, discharge, fluctuance, ecchymosis, rash in area of pain  []  DTR []  Motor/Sen   Resp [x]  Effort [x]  Ascult []  Percuss MSK [x]  Gait   [x]  ROM  []  Tone  []  Bal [x]  Insp/palp:    []  Back   CV [x]  Auscultate [x]  Carotids       [x]  Edema Puls: [x]  Pedal [x]  Fem  Skin [x]  Inspect -multiple skin tags-neck    B foot/ankle hyperpigmentatio [x]  Palp   Lymph []  Neck  [x]  Axillary : no LAD but significant ttp just distal to axilla  []   Femoral Psych [x]  Insight/judg  [x]  Affect [x]  Cog     LABS/STUDIES     Orders Only on 09/27/2004   Component Date Value Ref Range Status   . Glucose 09/27/2004 146* 65 - 110 mg/dL Final   . BUN 04/54/0981 7* 8 - 18 mg/dL Final   . Creatinine 19/14/7829 0.6  0.5 - 1.5 mg/dL Final   . Sodium 56/21/3086 138  135 - 145 mEq/L Final   . Potassium 09/27/2004 4.2  3.5 - 5.0 mEq/L Final   . Chloride 09/27/2004 100  97 - 107 mEq/L Final   . Bicarbonate 09/27/2004 28  24 - 31 mEq/L Final   . Calcium 09/27/2004 8.8  8.8 - 10.3 mg/dL Final       Lab Results   Component Value Date    NA 142 05/20/2018    K 4.1 05/20/2018    CL 104 05/20/2018    BUN 10 05/20/2018    CREAT 0.77  05/20/2018    MG 2.0 05/20/2018        Pertinent [facility]/imaging  personally reviewed by me  At first visit     Assessment and Plan:   Annalisa Colonna is a very pleasant 47 year old female   has a past medical history of Allergic rhinitis, Amenorrhea (05/20/2018), Anxiety and depression, Asthma, Chronic venous insufficiency, Encounter for tobacco  use cessation counseling (05/20/2018), Fatty liver (05/20/2018), Hyperlipidemia, unspecified hyperlipidemia type (05/20/2018), Hypertension, unspecified type (05/20/2018), Intractable right heel pain (05/20/2018), Obesity, unspecified classification, unspecified obesity type, unspecified whether serious comorbidity present (05/20/2018), Routine health maintenance (05/20/2018), Skin tags, multiple acquired, and Type 2 diabetes mellitus without complication, with long-term current use of insulin (CMS-HCC) (05/20/2018)., new, establish care, discuss:     Problem List Items Addressed This Visit        Cardiovascular    Hyperlipidemia, unspecified hyperlipidemia type     Normal 04/2018. Refill simva 40 qhs. Cont fish oil. Recheck today.          Relevant Medications    simvastatin (ZOCOR) 40 MG tablet    Other Relevant Orders    Lipid Panel Green Plasma Separator Tube    Hypertension, unspecified type - Primary     At goal. Asymptomatic. Refill lisinopril-hctz 20/12.5 2 tabs daily, atenolol 25 daily. Strict return precautions given.           Relevant Medications    lisinopril-hydrochlorothiazide (ZESTORETIC) 20-12.5 MG tablet    atenolol (TENORMIN) 25 MG tablet    Breast pain, left     X 2 weeks.  May be breast or just lateral to breast and just distal to axilla.  Significant ttp on exam but no masses, nipple discharge, rash appreciated in area of tenderness.  Never completed screening mammo when ordered in June 2019.  She does lift her 676 mos old grandson who continues to gain weight.    amenorrhea thus no menses in last 1-2 yrs.  May just be muscle strain.   -L breast/axillary  US  -For now, schedule screening mammo, may need to change to dx  -Wear looser bra  -Other supportive care for pain management   -gyn referral for amenorrhea   -Strict return precautions given.           Relevant Orders    US Breast Limited - Left       GI and Hepatology    Fatty liver     Transaminitis 04/2018. Never completed abd US. Will sched US. She has had other fatty liver work up that was neg. She is on metformin + statin. Asymptomatic. Strict return precautions given.           Relevant Orders    Comprehensive Metabolic Panel Green       Endocrine    Type 2 diabetes mellitus without complication, with long-term current use of insulin (CMS-HCC)     A1C 6.8 04/2018.  We had increased metformin to 1000 BID, and in turn pt decreased her novolin 70/30 from 50 units BID to 40 units BID on her own.  Home BS unknown.  Repeat A1C today.  Due for UACR.  Never schedule ophtho exam, print referral.   Never saw podiatry for foot pain, but normal monofilament exam 04/2018. Print referral.   Strict return precautions given.             Relevant Medications    metFORMIN (GLUCOPHAGE) 1000 MG tablet    Other Relevant Orders    Glycosylated Hgb(A1C), Blood Lavender    Random Urine Microalb/Creat Ratio Panel       Other    Amenorrhea     LMP 2 yrs go.  Advised by previous physician that this is related to her obesity.  Offered progesterone but patient declined, now considering.  04/2018 TSH, prolactin normal.   Declines upreg.   Consider estrogen, progesterone, LH, FSH in future.  No prior TVUS, ordered 04/2018, never completed, advised to schedule.  Refer gyn.         Relevant Orders    [facility]    Obesity, unspecified classification, unspecified obesity type, unspecified whether serious comorbidity present     Lost 16 lbs since June 2019 but due to decreased appetite from stress over last 1-2 mos. She also had abd pain with all foods that she also thought was stress related. Has resolved. Eval further  if recurs. Never saw bariatric surgery, print referral. Exc minimal- dog walking. Still has foot pain and never saw podiatry. Will print referral.   CTM.          Need for vaccination    Relevant Orders    Flu Vaccine, IM, >= 6 Months (Completed)          #RTC: Return in about 3 months (around 01/19/2019) for general f/u .    The above recommendation were discussed with the patient.  The patient has all questions answered satisfactorily and is in agreement with this recommended plan of care.    I spent 25 min  with the patient this visit, more than 50% of which was spent counseling/coordinating care regarding listed above.     Landry Corporal, D.O., 05/20/2018 3:32 PM  Clinical Practice Organization  [facility], [facility]  [address], [place] [address]

## 2018-10-21 ENCOUNTER — Encounter (INDEPENDENT_AMBULATORY_CARE_PROVIDER_SITE_OTHER): Payer: Self-pay | Admitting: Family Practice

## 2018-11-13 ENCOUNTER — Other Ambulatory Visit: Payer: No Typology Code available for payment source | Attending: Family Practice

## 2018-11-13 DIAGNOSIS — E785 Hyperlipidemia, unspecified: Secondary | ICD-10-CM | POA: Insufficient documentation

## 2018-11-13 DIAGNOSIS — Z794 Long term (current) use of insulin: Secondary | ICD-10-CM | POA: Insufficient documentation

## 2018-11-13 DIAGNOSIS — E119 Type 2 diabetes mellitus without complications: Secondary | ICD-10-CM | POA: Insufficient documentation

## 2018-11-13 DIAGNOSIS — K760 Fatty (change of) liver, not elsewhere classified: Secondary | ICD-10-CM | POA: Insufficient documentation

## 2018-11-13 LAB — LIPID(CHOL FRACT) PANEL, BLOOD
Cholesterol: 133 mg/dL (ref <200)
HDL-Cholesterol: 61 mg/dL
LDL-Chol (Calc): 45 mg/dL (ref <160)
Non-HDL Cholesterol: 72 mg/dL
Triglycerides: 134 mg/dL (ref 10–170)

## 2018-11-13 LAB — COMPREHENSIVE METABOLIC PANEL, BLOOD
ALT (SGPT): 76 U/L — ABNORMAL HIGH (ref 0–33)
AST (SGOT): 74 U/L — ABNORMAL HIGH (ref 0–32)
Albumin: 4.1 g/dL (ref 3.5–5.2)
Alkaline Phos: 61 U/L (ref 35–140)
Anion Gap: 14 mmol/L (ref 7–15)
BUN: 11 mg/dL (ref 6–20)
Bicarbonate: 25 mmol/L (ref 22–29)
Bilirubin, Tot: 0.54 mg/dL (ref <1.2)
Calcium: 9.2 mg/dL (ref 8.5–10.6)
Chloride: 103 mmol/L (ref 98–107)
Creatinine: 0.8 mg/dL (ref 0.51–0.95)
GFR: >60 mL/min
Glucose: 138 mg/dL — ABNORMAL HIGH (ref 70–99)
Potassium: 4 mmol/L (ref 3.5–5.1)
Sodium: 142 mmol/L (ref 136–145)
Total Protein: 6.7 g/dL (ref 6.0–8.0)

## 2018-11-13 LAB — GLYCOSYLATED HGB(A1C), BLOOD: Glyco Hgb (A1C): 7.3 % — ABNORMAL HIGH (ref 4.8–5.8)

## 2018-11-13 LAB — RANDOM URINE MICROALB/CREAT RATIO PANEL
Creatinine, Urine: 154 mg/dL (ref 29–226)
MALB/CR Ratio Random: UNDETERMINED mcg/mgCr (ref 0–30)
Microalbumin, Urine: <1.2 mg/dL (ref <2.0)

## 2018-11-14 ENCOUNTER — Encounter (INDEPENDENT_AMBULATORY_CARE_PROVIDER_SITE_OTHER): Payer: Self-pay | Admitting: Hospital

## 2018-11-14 ENCOUNTER — Encounter (INDEPENDENT_AMBULATORY_CARE_PROVIDER_SITE_OTHER): Payer: Self-pay | Admitting: Family Practice

## 2018-11-14 DIAGNOSIS — I10 Essential (primary) hypertension: Principal | ICD-10-CM

## 2018-11-14 DIAGNOSIS — Z794 Long term (current) use of insulin: Secondary | ICD-10-CM

## 2018-11-14 DIAGNOSIS — E119 Type 2 diabetes mellitus without complications: Secondary | ICD-10-CM

## 2018-11-14 MED ORDER — LISINOPRIL-HYDROCHLOROTHIAZIDE 20-12.5 MG OR TABS
2.00 | ORAL_TABLET | Freq: Every day | ORAL | 0 refills | Status: DC
Start: 2018-11-14 — End: 2018-11-24

## 2018-11-14 MED ORDER — ATENOLOL 25 MG OR TABS
25.00 mg | ORAL_TABLET | Freq: Every day | ORAL | 0 refills | Status: DC
Start: 2018-11-14 — End: 2018-11-25

## 2018-11-14 MED ORDER — METFORMIN HCL 1000 MG OR TABS
1000.00 mg | ORAL_TABLET | Freq: Two times a day (BID) | ORAL | 0 refills | Status: DC
Start: 2018-11-14 — End: 2018-11-24

## 2018-11-14 NOTE — Telephone Encounter (Signed)
From: Danielle [name]  To: [name], Danielle [name]  Sent: 11/14/2018 12:43 PM PST  Subject: RE:lab results    Hi Dr., I did notice that they almost doubled. I will Danielle [name] the ultrasound ASAP. Also I need 90 day refills for Metformin, Atenolol, Lisinopril and Simvastatin. Can you please send the [facility] order to [facility] [address] [place]? Thank you, Danielle [name]  ----- Message -----  From: [name], Danielle [name]  Sent: 11/14/2018 12:30 PM PST  To: Danielle Ranristina Maria Raspberry  Subject: lab results  Hello Danielle MesiCristina,    Your urine sample is normal.    Your A1C did go up from 6.8 to 7.3. Can you send me your blood sugars from the last 1 week?   I would likely have you go back up on the insulin to 50 units twice per day but would like to see all of your blood sugars. You can also come in for a visit to discuss this.     Your cholesterol levels were good so continue your medication.    Your liver tests are worse than before. Please complete your abdominal ultrasound as soon as you can.    Thank you.     Kind regards,  Landry Corporalmruti [name], D.O., 11/14/2018 12:29 PM  Clinical Practice Organization  [facility], [facility]  [id number]  [place], [place] [address]  [phone]  Fax: [phone]

## 2018-11-17 ENCOUNTER — Encounter (INDEPENDENT_AMBULATORY_CARE_PROVIDER_SITE_OTHER): Payer: Self-pay | Admitting: Family Practice

## 2018-11-17 NOTE — Telephone Encounter (Signed)
From: Danielle [name]  To: [name], DO  Sent: 11/16/2018 5:16 PM PST  Subject: RE:lab results    Hi Dr., I haven't heard back about the RX refills. Have they been ordered? I am running low and need them soon. Thank you.  Danielle [name]  ----- Message -----  From: [name], DO  Sent: 11/14/2018 12:30 PM PST  To: Danielle Ranristina Maria Brunson  Subject: lab results  Hello Danielle [name],    Your urine sample is normal.    Your A1C did go up from 6.8 to 7.3. Can you send me your blood sugars from the last 1 week?   I would likely have you go back up on the insulin to 50 units twice per day but would like to see all of your blood sugars. You can also come in for a visit to discuss this.     Your cholesterol levels were good so continue your medication.    Your liver tests are worse than before. Please complete your abdominal ultrasound as soon as you can.    Thank you.     Kind regards,  Landry Corporalmruti Borad, D.O., 11/14/2018 12:29 PM  Clinical Practice Organization  [facility], [facility]  [id number]  [place], [place] [address]  Phone:[phone]  Fax: [phone]

## 2018-11-18 ENCOUNTER — Encounter (INDEPENDENT_AMBULATORY_CARE_PROVIDER_SITE_OTHER): Payer: Self-pay | Admitting: Family Practice

## 2018-11-18 DIAGNOSIS — E785 Hyperlipidemia, unspecified: Principal | ICD-10-CM

## 2018-11-18 MED ORDER — SIMVASTATIN 40 MG OR TABS
40.0000 mg | ORAL_TABLET | Freq: Every evening | ORAL | 0 refills | Status: DC
Start: 2018-11-18 — End: 2019-05-04

## 2018-11-18 NOTE — Telephone Encounter (Signed)
From: Danielle [name]  To: [name], DO  Sent: 11/17/2018 6:12 PM PST  Subject: RE:lab results    Hi [name],   It's Lisinopril, Simvastatin, [name]   Thank you!  ----- Message -----  From: Danielle [name]  Sent: 11/17/2018 8:06 AM PST  To: Danielle [name]  Subject: RE:lab results  Good Morning Danielle [name],    Is it possible if you can list your medication that is needing a refill.     Thank You,   [name], MA    ----- Message -----   From: Danielle [name]   Sent: 11/16/2018 5:16 PM PST   To: [name], DO  Subject: RE:lab results    Hi Dr., I haven't heard back about the RX refills. Have they been ordered? I am running low and need them soon. Thank you.  Danielle [name]  ----- Message -----  From: [name], DO  Sent: 11/14/2018 12:30 PM PST  To: Danielle Ranristina Maria Frate  Subject: lab results  Hello Danielle [name],    Your urine sample is normal.    Your A1C did go up from 6.8 to 7.3. Can you send me your blood sugars from the last 1 week?   I would likely have you go back up on the insulin to 50 units twice per day but would like to see all of your blood sugars. You can also come in for a visit to discuss this.     Your cholesterol levels were good so continue your medication.    Your liver tests are worse than before. Please complete your abdominal ultrasound as soon as you can.    Thank you.     Kind regards,  Landry Corporalmruti Borad, D.O., 11/14/2018 12:29 PM  Clinical Practice Organization  [facility], [facility]  [id number]  [place], [place] [address]  [phone]  Fax: [phone]

## 2018-11-21 ENCOUNTER — Other Ambulatory Visit (INDEPENDENT_AMBULATORY_CARE_PROVIDER_SITE_OTHER): Payer: Self-pay | Admitting: Family Practice

## 2018-11-21 DIAGNOSIS — Z794 Long term (current) use of insulin: Secondary | ICD-10-CM

## 2018-11-21 DIAGNOSIS — I10 Essential (primary) hypertension: Principal | ICD-10-CM

## 2018-11-21 DIAGNOSIS — E119 Type 2 diabetes mellitus without complications: Secondary | ICD-10-CM

## 2018-11-24 ENCOUNTER — Encounter (INDEPENDENT_AMBULATORY_CARE_PROVIDER_SITE_OTHER): Payer: Self-pay | Admitting: Family Practice

## 2018-11-24 MED ORDER — METFORMIN HCL 1000 MG OR TABS
ORAL_TABLET | ORAL | 0 refills | Status: DC
Start: 2018-11-24 — End: 2018-11-25

## 2018-11-24 MED ORDER — LISINOPRIL-HYDROCHLOROTHIAZIDE 20-12.5 MG OR TABS
ORAL_TABLET | ORAL | 0 refills | Status: DC
Start: 2018-11-24 — End: 2018-11-25

## 2018-11-24 NOTE — Telephone Encounter (Signed)
MyChart Message Sent To Patient:    Hello Danielle [name],   We have received your refill request, your medication was refilled and sent to your pharmacy.   Please contact them to obtain your medication.     If you have any questions, please feel free to send us a MyChart message, or call us at [phone]    Thank You,  Alvino Chapelhristina G.  [Medical Assistant]

## 2018-11-24 NOTE — Telephone Encounter (Signed)
From: Danielle [name]  To: [name], Danielle [name]  Sent: 11/22/2018 7:37 AM PST  Subject: RE:lab results    Good morning I went to pick up my four RX and only Simvastantin had been ordered. I need the other three please. I placed the order for refills on the [facility] app so you will probably get a request for approval today. This is for the [facility] [address]. Thanks!  ----- Message -----  From: Danielle NixonJulie Ann Alviz [name]  Sent: 11/18/2018 7:59 AM PST  To: Danielle [name]  Subject: RE:lab results  Hello Danielle [name],   I got it looks like some of the medication has been filled by Dr Denman GeorgeBorad on 11/14/2018. I woill have to request one refill for simvastatin 40mg  tablet.      [name], MA    ----- Message -----   From: Danielle [name]   Sent: 11/17/2018 6:12 PM PST   To: Danielle D Borad, Danielle [name]  Subject: RE:lab results    Hi Danielle FanningJulie,   It's Lisinopril, Simvastatin, Atenolol and Metformin. Please order 90 day refills this time and send to the [facility] at [facility] [place].   Thank you!  ----- Message -----  From: Danielle NixonJulie Ann Alviz [name]  Sent: 11/17/2018 8:06 AM PST  To: Danielle [name]  Subject: RE:lab results  Good Morning Danielle [name],    Is it possible if you can list your medication that is needing a refill.     Thank You,   [name], MA    ----- Message -----   From: Danielle Ranristina Maria Michelsen   Sent: 11/16/2018 5:16 PM PST   To: Danielle D Borad, Danielle [name]  Subject: RE:lab results    Hi Dr., I haven't heard back about the RX refills. Have they been ordered? I am running low and need them soon. Thank you.  Danielle [name]  ----- Message -----  From: [name], Danielle [name]  Sent: 11/14/2018 12:30 PM PST  To: Danielle Ranristina Maria Molino  Subject: lab results  Hello Danielle [name],    Your urine sample is normal.    Your A1C did go up from 6.8 to 7.3. Can you send me your blood sugars from the last 1 week?   I would likely have you go back up on the insulin to 50 units twice per day but would like to see all of  your blood sugars. You can also come in for a visit to discuss this.     Your cholesterol levels were good so continue your medication.    Your liver tests are worse than before. Please complete your abdominal ultrasound as soon as you can.    Thank you.     Kind regards,  Landry Corporalmruti [name], D.O., 11/14/2018 12:29 PM  Clinical Practice Organization  [facility]  [id number]  [place], [place] [id number]  Phone:[phone]  Fax: [phone]

## 2018-11-24 NOTE — Telephone Encounter (Signed)
MyChart Message Sent To Patient:    Hello Danielle [name],   We have received your refill request, your medication was refilled and sent to your pharmacy.   Please contact them to obtain your medication:  -MetFORMIN (GLUCOPHAGE) 1000 MG tablet  -Lisinopril-hydrochlorothiazide (ZESTORETIC) 20-12.5 MG tablet  If you have any questions, please feel free to send us a MyChart message, or call us at [phone]    Thank You,  Alvino Chapelhristina G.  [Medical Assistant]

## 2018-11-25 ENCOUNTER — Other Ambulatory Visit (INDEPENDENT_AMBULATORY_CARE_PROVIDER_SITE_OTHER): Payer: Self-pay | Admitting: Family Practice

## 2018-11-25 DIAGNOSIS — Z794 Long term (current) use of insulin: Secondary | ICD-10-CM

## 2018-11-25 DIAGNOSIS — I10 Essential (primary) hypertension: Secondary | ICD-10-CM

## 2018-11-25 MED ORDER — METFORMIN HCL 1000 MG OR TABS
ORAL_TABLET | ORAL | 0 refills | Status: DC
Start: 2018-11-25 — End: 2019-02-15

## 2018-11-25 MED ORDER — ATENOLOL 25 MG OR TABS
25.00 mg | ORAL_TABLET | Freq: Every day | ORAL | 0 refills | Status: DC
Start: 2018-11-25 — End: 2019-03-28

## 2018-11-25 MED ORDER — LISINOPRIL-HYDROCHLOROTHIAZIDE 20-12.5 MG OR TABS
ORAL_TABLET | ORAL | 0 refills | Status: DC
Start: 2018-11-25 — End: 2019-03-28

## 2018-11-25 NOTE — Telephone Encounter (Signed)
Medication requested: Atenolol    Patient is requesting refill of medication, please see orders for preloaded prescription refill.    Last visit with this MD:    10/19/2018  Next appointment with this MD:   01/18/2019    Last office visit:     10/19/2018  Next office visit:     01/18/2019    List of HM items due or overdue:  Health Maintenance Due   Topic Date Due   . BREAST CANCER SCREENING  1971-11-04   . DM_RETINA EXAM  1971-11[birth year]04

## 2018-12-05 ENCOUNTER — Other Ambulatory Visit (INDEPENDENT_AMBULATORY_CARE_PROVIDER_SITE_OTHER): Payer: Self-pay | Admitting: Family Practice

## 2018-12-05 DIAGNOSIS — N644 Mastodynia: Principal | ICD-10-CM

## 2018-12-08 ENCOUNTER — Other Ambulatory Visit (INDEPENDENT_AMBULATORY_CARE_PROVIDER_SITE_OTHER): Payer: No Typology Code available for payment source

## 2019-01-18 ENCOUNTER — Encounter (INDEPENDENT_AMBULATORY_CARE_PROVIDER_SITE_OTHER): Payer: No Typology Code available for payment source | Admitting: Family Practice

## 2019-02-15 ENCOUNTER — Other Ambulatory Visit (INDEPENDENT_AMBULATORY_CARE_PROVIDER_SITE_OTHER): Payer: Self-pay | Admitting: Family Practice

## 2019-02-15 DIAGNOSIS — E119 Type 2 diabetes mellitus without complications: Principal | ICD-10-CM

## 2019-02-15 DIAGNOSIS — Z794 Long term (current) use of insulin: Principal | ICD-10-CM

## 2019-02-15 MED ORDER — METFORMIN HCL 1000 MG OR TABS
ORAL_TABLET | ORAL | 0 refills | Status: DC
Start: 2019-02-15 — End: 2019-05-04

## 2019-03-18 ENCOUNTER — Encounter (INDEPENDENT_AMBULATORY_CARE_PROVIDER_SITE_OTHER): Payer: Self-pay | Admitting: Family Practice

## 2019-03-18 DIAGNOSIS — J4520 Mild intermittent asthma, uncomplicated: Principal | ICD-10-CM

## 2019-03-20 MED ORDER — ALBUTEROL SULFATE 108 (90 BASE) MCG/ACT IN AERS
2.00 | INHALATION_SPRAY | Freq: Four times a day (QID) | RESPIRATORY_TRACT | 3 refills | Status: AC | PRN
Start: 2019-03-20 — End: ?

## 2019-03-20 NOTE — Telephone Encounter (Signed)
From: Alric Ran  To: Domingo Cocking, DO  Sent: 03/18/2019 12:29 PM PDT  Subject: 20-Other    Hi Dr Denman George,   I hope you are doing well. I was writing to request an rx for the ProAir HFA inhaler. I always have to have one handy for my mild asthma and I am on my last one with only 48 pumps left. Can you please send it to the [facility] at [address] [facility] [phone].   I am doing well, working from home and taking all precautions necessary.   Take care and stay safe,  Jakeira Mcmurrey  [phone]

## 2019-03-28 ENCOUNTER — Other Ambulatory Visit (INDEPENDENT_AMBULATORY_CARE_PROVIDER_SITE_OTHER): Payer: Self-pay | Admitting: Family Practice

## 2019-03-28 DIAGNOSIS — I10 Essential (primary) hypertension: Principal | ICD-10-CM

## 2019-03-28 MED ORDER — ATENOLOL 25 MG OR TABS
ORAL_TABLET | ORAL | 0 refills | Status: DC
Start: 2019-03-28 — End: 2019-06-22

## 2019-03-28 MED ORDER — LISINOPRIL-HYDROCHLOROTHIAZIDE 20-12.5 MG OR TABS
ORAL_TABLET | ORAL | 0 refills | Status: DC
Start: 2019-03-28 — End: 2019-06-22

## 2019-03-28 NOTE — Telephone Encounter (Signed)
Refill Request Received.   Please see orders for preloaded prescription refill:   -Atenolol  -Lisinopril/HCTZ    Last Refilled:   11/25/2018  Last visit with this physician:      10/19/2018  Next appointment with this physician:      No Upcoming Appointments Set

## 2019-04-24 IMAGING — MR MRI BRAIN W/WO CONTRAST
8 of 10 series · 34 of 48 positions shown · IV contrast (prohance)
Comparison: None.

INDICATION: Memory loss
TECHNIQUE: Multiplanar, multiecho imaging was performed, including fluid sensitive and pre- and postcontrast T1-weighted sequences; IV contrast was administered to potentially improve disease detection.

Contrast agent: 15 mL of Prohance administered intravenously prior to post-contrast imaging.

[Series 1: bSSFP · axial · 10.0mm · 1.17mm/px · z∈[-40,+150]mm · 3 of 15 slices shown]
[im 1/15]
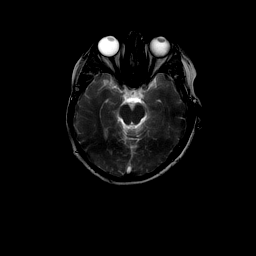
[im 8/15]
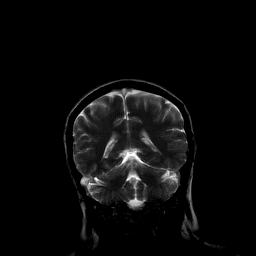
[im 15/15]
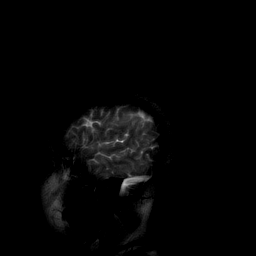

[Series 2: t1_sag · sagittal · 5.0mm · 0.45mm/px · 5 of 23 slices shown]
[im 1/23]
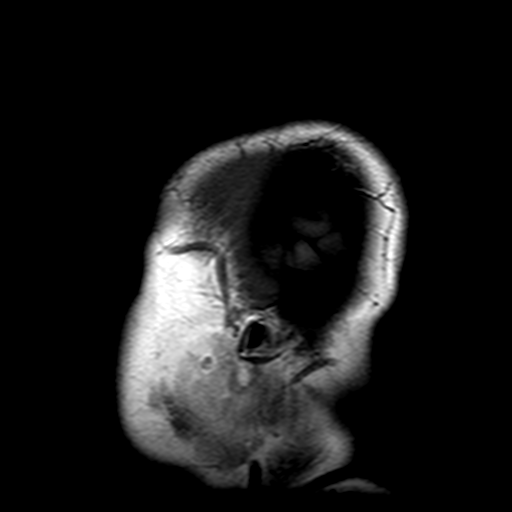
[im 6/23]
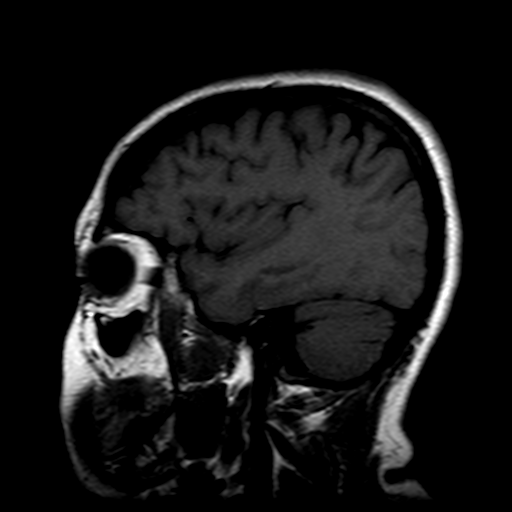
[im 12/23]
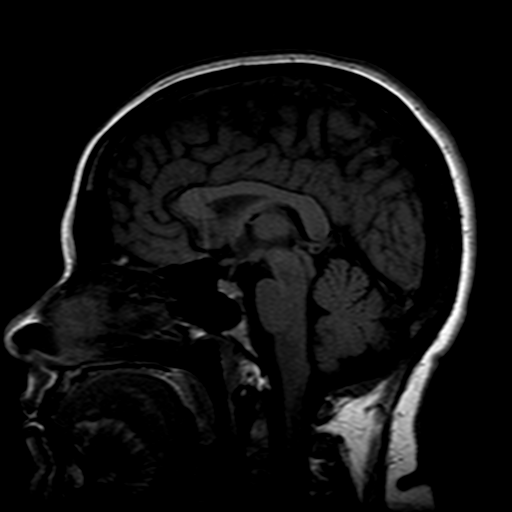
[im 17/23]
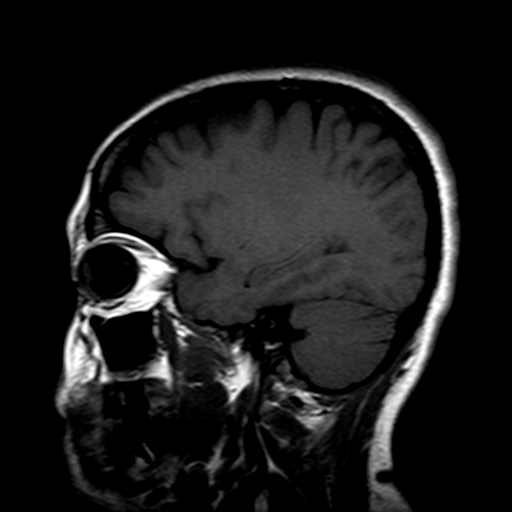
[im 23/23]
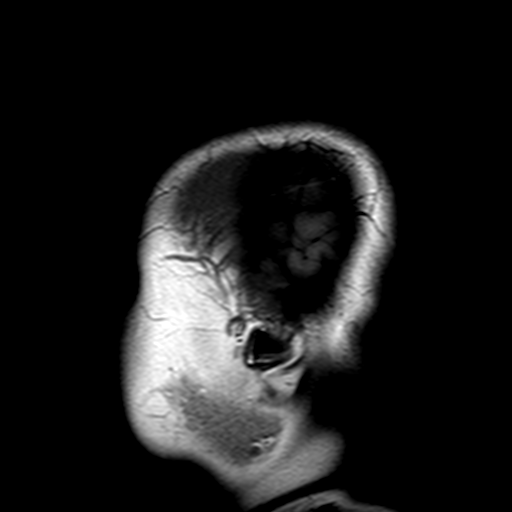

[Series 3: flair_axial_fs · axial · 5.0mm · 0.90mm/px · z∈[-103,+40]mm · 5 of 25 slices shown]
[im 1/25]
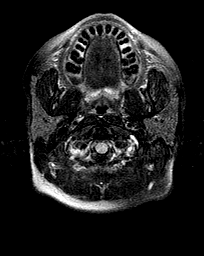
[im 7/25]
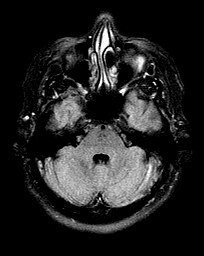
[im 13/25]
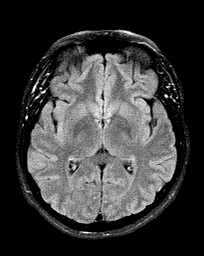
[im 19/25]
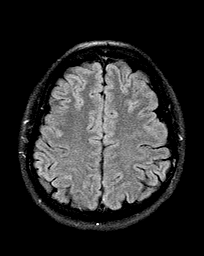
[im 25/25]
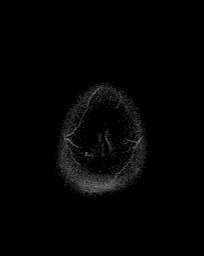

[Series 4: t2_axial · axial · 5.0mm · 0.45mm/px · z∈[-103,+40]mm · 5 of 25 slices shown]
[im 1/25]
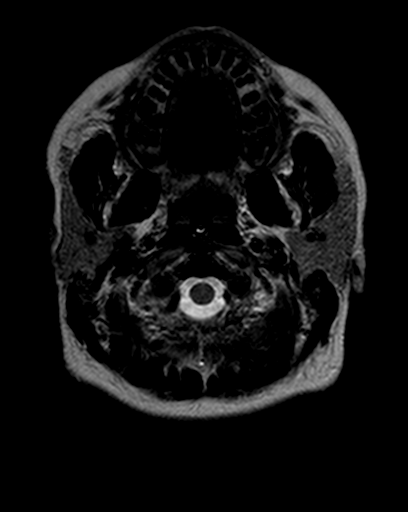
[im 7/25]
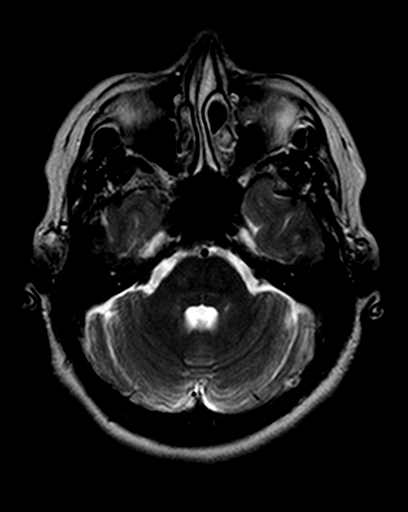
[im 13/25]
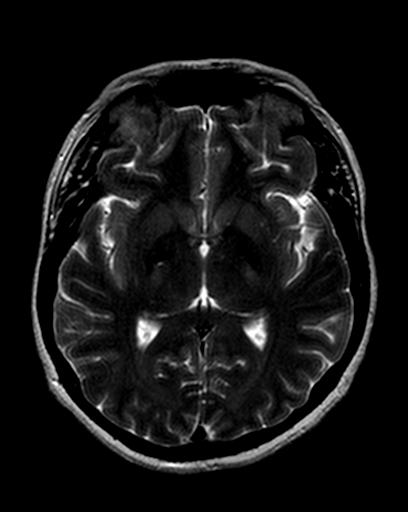
[im 19/25]
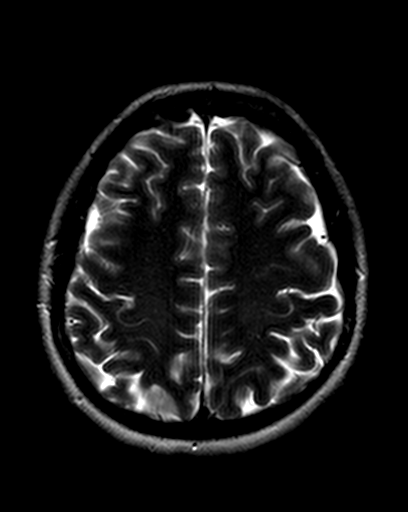
[im 25/25]
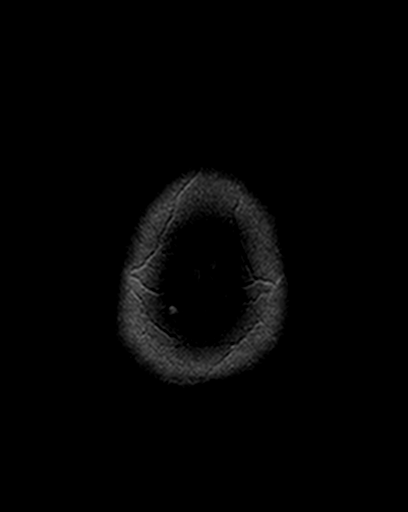

[Series 5: t1_axial · axial · 5.0mm · 0.45mm/px · z∈[-103,+40]mm · 5 of 25 slices shown]
[im 1/25]
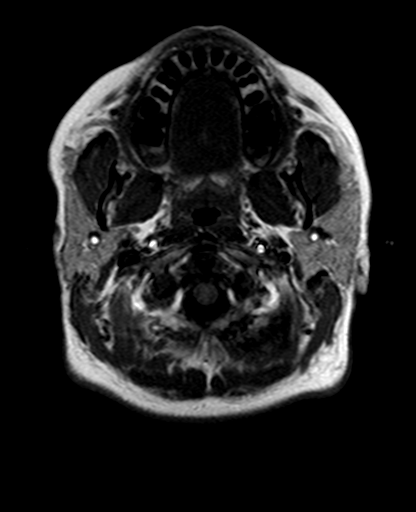
[im 7/25]
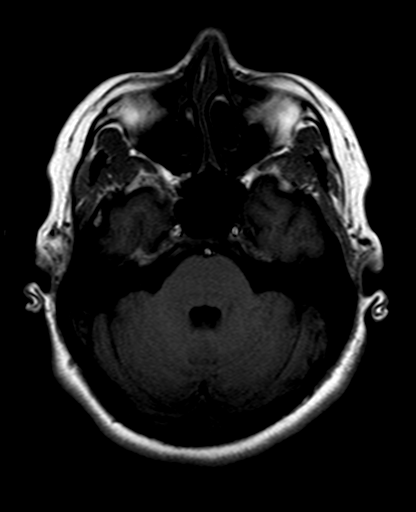
[im 13/25]
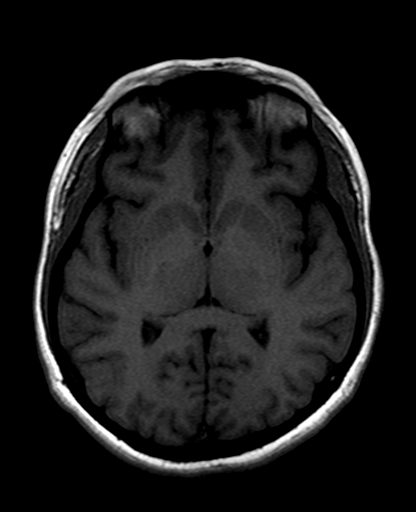
[im 19/25]
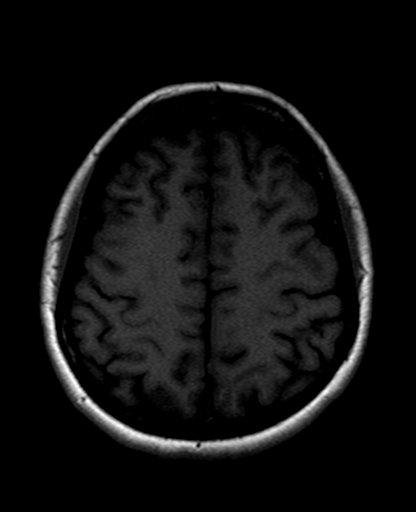
[im 25/25]
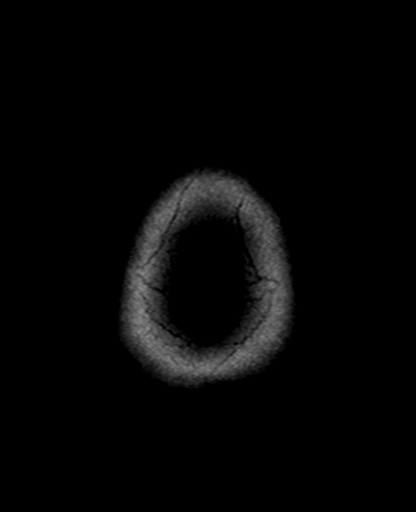

[Series 6: DWI · axial · 5.0mm · 0.90mm/px · z∈[-102,+35]mm · 5 of 24 slices shown (1 of 2)]
[im 1/24]
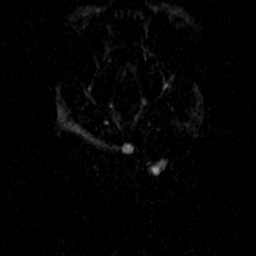
[im 6/24]
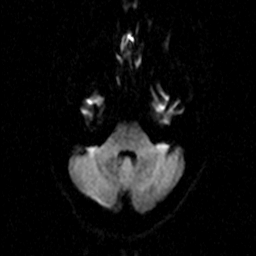
[im 12/24]
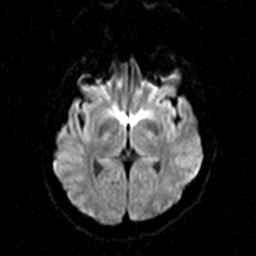
[im 18/24]
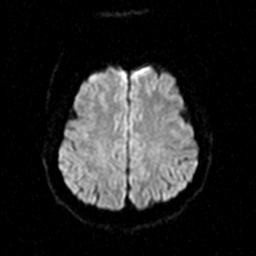
[im 24/24]
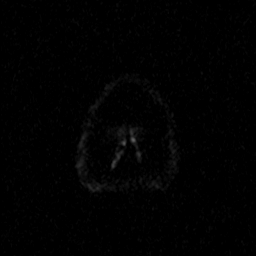

[Series 7: DWI · axial · 5.0mm · 0.90mm/px · z∈[-102,+35]mm · 5 of 24 slices shown (2 of 2)]
[im 1/24]
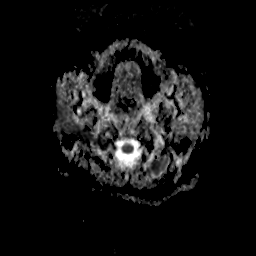
[im 6/24]
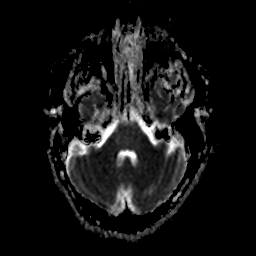
[im 12/24]
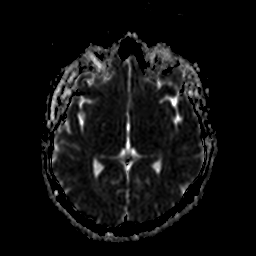
[im 18/24]
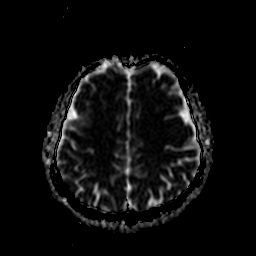
[im 24/24]
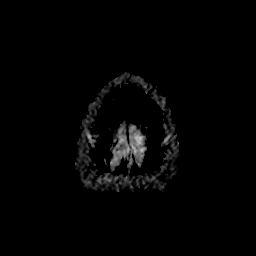

[Series 8: flash axial · axial · 5.0mm · 0.45mm/px · 1 of 25 slices shown]
[im 1/25]
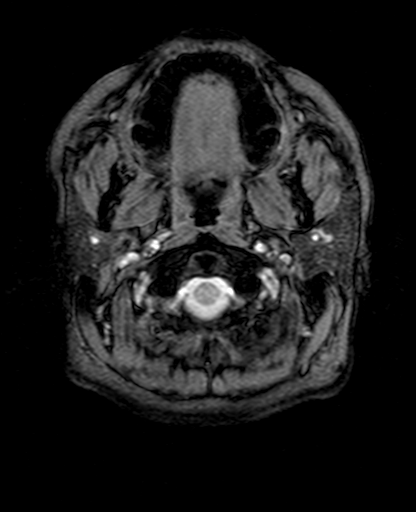

[34 of 48 positions shown; findings below may reference images not displayed]

FINDINGS: No restricted diffusion. No midline shift or mass effect. No extra-axial fluid collection. No evidence of acute intrarenal hemorrhage, mass, or cerebral edema. Visualized vascular flow voids are unremarkable. No abnormal signal in the brain parenchyma. No chronic ischemic changes. Orbits are unremarkable. Paranasal sinuses are clear. Concha bullosa. Mastoid air cells are clear. Vascular loops extend into less than 50% of both IACs. No suspicious postcontrast enhancement.
IMPRESSION: Unremarkable pre- and postcontrast MRI of the brain.

## 2019-04-27 ENCOUNTER — Other Ambulatory Visit (INDEPENDENT_AMBULATORY_CARE_PROVIDER_SITE_OTHER): Payer: Self-pay | Admitting: Family Practice

## 2019-04-27 DIAGNOSIS — Z794 Long term (current) use of insulin: Secondary | ICD-10-CM

## 2019-04-27 NOTE — Telephone Encounter (Signed)
Incoming fax refill request from pharmacy    Established with: Borad, Amruti D   Last OV with PCP: Visit date not found   Next OV with PCP: Visit date not found   Last OV in department: Visit date not found     No Known Allergies     Current Medication(s):       Requested Medication(s):  Requested Prescriptions     Pending Prescriptions Disp Refills    metFORMIN (GLUCOPHAGE) 1000 MG tablet 120 tablet 0     Sig: Take 1 tablet (1,000 mg) by mouth 2 times daily (with meals).       Send to:         [facility] [address] [place], [place] - [address]  [address] [facility] [address]  [place] [address]  Phone: [phone] Fax: [phone]       Last labs:   Lab Results   Component Value Date    CHOL 133 11/13/2018    HDL 61 11/13/2018    LDLCALC 45 11/13/2018    TRIG 134 11/13/2018    TSH 2.73 05/20/2018    A1C 7.3 (H) 11/13/2018        Blood Pressure   10/19/18 117/69   05/20/18 158/90        Health Maintenance Due   Topic Date Due    BREAST CANCER SCREENING  21-Dec-1970    DM_RETINA EXAM  September 11, 1971    PHQ9 Depression Monitoring doc flowsheet  02/17/2019            Encounter created by Care Assist MA.  If further action required please route encounter to appropriate in clinic MA/LVN/Resident pool.

## 2019-04-27 NOTE — Telephone Encounter (Signed)
This patient needs a follow up appointment either in person or video visit. 40 minutes.

## 2019-04-28 ENCOUNTER — Other Ambulatory Visit (INDEPENDENT_AMBULATORY_CARE_PROVIDER_SITE_OTHER): Payer: Self-pay | Admitting: Family Practice

## 2019-04-28 DIAGNOSIS — E785 Hyperlipidemia, unspecified: Secondary | ICD-10-CM

## 2019-04-28 DIAGNOSIS — Z794 Long term (current) use of insulin: Secondary | ICD-10-CM

## 2019-04-28 NOTE — Telephone Encounter (Signed)
attempted to call patient and was unsuccessful to reach, if patient calls back please schedule per PCP last message

## 2019-04-28 NOTE — Telephone Encounter (Signed)
Needs a 40 min F2F visit. Will need labs so best to come in for an appointment.    Thank you.     Kind regards,  Landry Corporal, D.O., 04/28/2019 2:20 PM  Clinical Practice Organization  [facility]  [address], [place] [address]  Phone:[phone]  Fax: [phone]

## 2019-04-28 NOTE — Telephone Encounter (Signed)
appt set 6/4    -Alvino Chapel Assistant]

## 2019-05-01 ENCOUNTER — Encounter (INDEPENDENT_AMBULATORY_CARE_PROVIDER_SITE_OTHER): Payer: Self-pay | Admitting: Hospital

## 2019-05-01 NOTE — Telephone Encounter (Signed)
CALL BACK    Pharmacy is following up on the refill, I notified pharmacy that the patient need to schedule an appt.     Encounter created by Care Assist MA.  If further action required please route encounter to appropriate in clinic MA/LVN/Resident Pool

## 2019-05-04 ENCOUNTER — Other Ambulatory Visit (INDEPENDENT_AMBULATORY_CARE_PROVIDER_SITE_OTHER): Payer: No Typology Code available for payment source | Attending: Family Practice

## 2019-05-04 ENCOUNTER — Ambulatory Visit (INDEPENDENT_AMBULATORY_CARE_PROVIDER_SITE_OTHER): Payer: No Typology Code available for payment source | Admitting: Family Practice

## 2019-05-04 VITALS — BP 137/84 | HR 71 | Temp 98.2°F | Ht 68.75 in | Wt 336.9 lb

## 2019-05-04 DIAGNOSIS — Z0000 Encounter for general adult medical examination without abnormal findings: Secondary | ICD-10-CM

## 2019-05-04 DIAGNOSIS — J309 Allergic rhinitis, unspecified: Secondary | ICD-10-CM

## 2019-05-04 DIAGNOSIS — E785 Hyperlipidemia, unspecified: Secondary | ICD-10-CM

## 2019-05-04 DIAGNOSIS — F32A Depression, unspecified: Secondary | ICD-10-CM

## 2019-05-04 DIAGNOSIS — F329 Major depressive disorder, single episode, unspecified: Secondary | ICD-10-CM

## 2019-05-04 DIAGNOSIS — K760 Fatty (change of) liver, not elsewhere classified: Secondary | ICD-10-CM

## 2019-05-04 DIAGNOSIS — E119 Type 2 diabetes mellitus without complications: Secondary | ICD-10-CM | POA: Insufficient documentation

## 2019-05-04 DIAGNOSIS — Z794 Long term (current) use of insulin: Secondary | ICD-10-CM

## 2019-05-04 DIAGNOSIS — N912 Amenorrhea, unspecified: Secondary | ICD-10-CM

## 2019-05-04 DIAGNOSIS — F419 Anxiety disorder, unspecified: Secondary | ICD-10-CM

## 2019-05-04 DIAGNOSIS — N644 Mastodynia: Secondary | ICD-10-CM

## 2019-05-04 DIAGNOSIS — E669 Obesity, unspecified: Secondary | ICD-10-CM

## 2019-05-04 DIAGNOSIS — I10 Essential (primary) hypertension: Secondary | ICD-10-CM

## 2019-05-04 DIAGNOSIS — M79671 Pain in right foot: Secondary | ICD-10-CM

## 2019-05-04 LAB — CBC WITH DIFF, BLOOD
ANC-Automated: 5.6 10*3/uL (ref 1.6–7.0)
Abs Basophils: 0.1 10*3/uL (ref <0.1)
Abs Eosinophils: 0.5 10*3/uL (ref 0.1–0.5)
Abs Lymphs: 3 10*3/uL (ref 0.8–3.1)
Abs Monos: 0.7 10*3/uL (ref 0.2–0.8)
Basophils: 1 %
Eosinophils: 5 %
Hct: 48.3 % — ABNORMAL HIGH (ref 34.0–45.0)
Hgb: 15.5 gm/dL (ref 11.2–15.7)
Lymphocytes: 30 %
MCH: 31.5 pg (ref 26.0–32.0)
MCHC: 32.1 g/dL (ref 32.0–36.0)
MCV: 98.2 um3 — ABNORMAL HIGH (ref 79.0–95.0)
MPV: 10.3 fL (ref 9.4–12.4)
Monocytes: 7 %
Plt Count: 260 10*3/uL (ref 140–370)
RBC: 4.92 10*6/uL (ref 3.90–5.20)
RDW: 13.2 % (ref 12.0–14.0)
Segs: 57 %
WBC: 9.9 10*3/uL (ref 4.0–10.0)

## 2019-05-04 LAB — COMPREHENSIVE METABOLIC PANEL, BLOOD
ALT (SGPT): 60 U/L — ABNORMAL HIGH (ref 0–33)
AST (SGOT): 63 U/L — ABNORMAL HIGH (ref 0–32)
Albumin: 4.2 g/dL (ref 3.5–5.2)
Alkaline Phos: 71 U/L (ref 35–140)
Anion Gap: 13 mmol/L (ref 7–15)
BUN: 14 mg/dL (ref 6–20)
Bicarbonate: 30 mmol/L — ABNORMAL HIGH (ref 22–29)
Bilirubin, Tot: 0.52 mg/dL (ref <1.2)
Calcium: 9.7 mg/dL (ref 8.5–10.6)
Chloride: 101 mmol/L (ref 98–107)
Creatinine: 0.81 mg/dL (ref 0.51–0.95)
GFR: >60 mL/min
Glucose: 128 mg/dL — ABNORMAL HIGH (ref 70–99)
Potassium: 4.4 mmol/L (ref 3.5–5.1)
Sodium: 144 mmol/L (ref 136–145)
Total Protein: 7.4 g/dL (ref 6.0–8.0)

## 2019-05-04 LAB — TSH, BLOOD: TSH: 1.99 u[IU]/mL (ref 0.27–4.20)

## 2019-05-04 LAB — LIPID(CHOL FRACT) PANEL, BLOOD
Cholesterol: 180 mg/dL (ref <200)
HDL-Cholesterol: 62 mg/dL
LDL-Chol (Calc): 92 mg/dL (ref <160)
Non-HDL Cholesterol: 118 mg/dL
Triglycerides: 130 mg/dL (ref 10–170)

## 2019-05-04 LAB — GLYCOSYLATED HGB(A1C), BLOOD: Glyco Hgb (A1C): 6.6 % — ABNORMAL HIGH (ref 4.8–5.8)

## 2019-05-04 MED ORDER — MAGNESIUM GLYCINATE PLUS PO: ORAL | Status: AC

## 2019-05-04 MED ORDER — SIMVASTATIN 40 MG OR TABS
40.0000 mg | ORAL_TABLET | Freq: Every evening | ORAL | 0 refills | Status: DC
Start: 2019-05-04 — End: 2019-09-01

## 2019-05-04 MED ORDER — NIACIN (ANTIHYPERLIPIDEMIC) 500 MG OR TBCR
500.00 mg | EXTENDED_RELEASE_TABLET | Freq: Every evening | ORAL | Status: DC
Start: ? — End: 2020-03-18

## 2019-05-04 MED ORDER — METFORMIN HCL 1000 MG OR TABS
1000.0000 mg | ORAL_TABLET | Freq: Two times a day (BID) | ORAL | 0 refills | Status: DC
Start: 2019-05-04 — End: 2019-08-04

## 2019-05-04 NOTE — Assessment & Plan Note (Signed)
A1C uptrending 6.8 04/2018-->7.3 10/2018.  Not checking Home BS.  metf 1000 BID, her novolin 70/30 from 50 units BID.  Sig improvement in diet last 3 mos, +wt loss, but no exc.  Never scheduled ophtho or podiatry appts.  -due for a1c, uacr  -CGM too expensive for pt; after A1C resulted will come up with better accucheck regimen for her   -given # to sched ophtho, podiatry appts   -cont healthy diet  -start exc  -Strict return precautions given.

## 2019-05-04 NOTE — Assessment & Plan Note (Signed)
Still has not had a period for at least 69yrs.  Never completed pelvic US or sched with gyn.  Encouraged sched pelvic US and then f/u with gyn, given # for clinic.  Strict return precautions given.

## 2019-05-04 NOTE — Assessment & Plan Note (Signed)
Mildly elevated today.  Not checking at home.  Asymptomatic.  Compliant with her meds -  lisinopril-hctz 20/12.5 2 tabs daily, atenolol 25 daily.   Check bmp, mg.   Strict return precautions given.

## 2019-05-04 NOTE — Interdisciplinary (Signed)
Blood drawn from left arm with 21 gauge needle. 2 tubes taken.   Patient identity authenticated by Kim Trevino.

## 2019-05-04 NOTE — Assessment & Plan Note (Signed)
Nl 10/2018.  DM II.  Compliant with simvastatin 40 daily.  Started niacin in last few mos 2/2 chornic leg cramping and helping.  Sig improvement in diet last 3 months resulting in approx 30 lbs wt loss since nov 2019.   Not exercising.  -repeat FLP given fasting and having other labs done today.  -cont healthy diet  -start exc

## 2019-05-04 NOTE — Assessment & Plan Note (Addendum)
Body mass index is 50.11 kg/m.  Date Weight Recorded 05/04/2019 10/19/2018 05/20/2018   Metric 152.817 kg 157.398 kg 119.296 kg   Pounds/Ounces 336 lb 14.4 oz 347 lb 263 lb    See "HL"

## 2019-05-04 NOTE — Assessment & Plan Note (Signed)
Sig improved but dull ache or numbness feeling. Monofilament testing normal. Never sched w/ podiatry, given # today. Strict return precautions given.

## 2019-05-04 NOTE — Patient Instructions (Addendum)
Schedule an appointment with your gynecology   [facility] [address]: [phone]    Schedule an appointment with the eye doctor:  Ophthalmology Department scheduling phone numbers:   [place]: [phone]  [place]: [phone]    Schedule an appointment with a podiatrist:  Orthopedics Scheduling Phone number:  [phone]

## 2019-05-04 NOTE — Assessment & Plan Note (Signed)
Was having panic attacks at beg of covid pandemic but improved with stopping watching the news and lavender/mg supplement. Still on wellbutrin 150 xl daily. Cont to decline therapy.Strict return precautions given.

## 2019-05-04 NOTE — Assessment & Plan Note (Signed)
Resolved so never had mammo/US completed.  Was likely muscle strain from carrying her grandson.  CTM.  Strict return precautions given.

## 2019-05-04 NOTE — Progress Notes (Signed)
[facility], [facility] OUTPATIENT PROGRESS NOTE       PATIENT:  Danielle [name]  MRN:  [id number]  DOB:  1971-07-22  DATE OF SERVICE:  05/20/2018  PRIMARY CARE PROVIDER: Carmelina Dane  TODAY'S PROVIDER: Carmelina Dane, DO  CC   Follow Up (Medication - simvastatin and metformin)      HPI   Danielle [name] is a 48 year old female who  has a past medical history of Allergic rhinitis, Amenorrhea (05/20/2018), Anxiety and depression, Asthma, Chronic venous insufficiency, Encounter for tobacco use cessation counseling (05/20/2018), Fatty liver (05/20/2018), Hyperlipidemia, unspecified hyperlipidemia type (05/20/2018), Hypertension, unspecified type (05/20/2018), Intractable right heel pain (05/20/2018), Obesity, unspecified classification, unspecified obesity type, unspecified whether serious comorbidity present (05/20/2018), Routine health maintenance (05/20/2018), Skin tags, multiple acquired, and Type 2 diabetes mellitus without complication, with long-term current use of insulin (CMS-HCC) (05/20/2018). and presents for the following:    Last visit was Nov 2019, never followed up 10/2019      Needs med refills    T 99- no other sx, fanning self , checks temp at home weekly and always normal     Mg/lavender thought would help with her anxiety attacks and has , stopped watching news, working from home since march and able to continue this       Hyperlipidemia, unspecified hyperlipidemia type     Normal 04/2018. Refill simva 40 qhs. Cont fish oil. Recheck today.     10/2018 normal   Refill simva 40 mg   Started niacin for leg pain and helped          Relevant Medications    simvastatin (ZOCOR) 40 MG tablet    Other Relevant Orders    Lipid Panel Green Plasma Separator Tube    Hypertension, unspecified type - Primary     At goal. Asymptomatic. Refill lisinopril-hctz 20/12.5 2 tabs daily, atenolol 25 daily. Strict return precautions given.    Vitals:    05/04/19 1005 05/04/19 1042   BP: 137/84    BP Location: Left  arm    BP Patient Position: Sitting    BP cuff size: Large    Pulse: 71    Temp: 99 F (37.2 C) 98.2 F (36.8 C)   TempSrc: Oral Oral   SpO2: 96%    Weight: 152.8 kg (336 lb 14.4 oz)    Height: 5' 8.75" (1.746 m)      Not checking at home  Hasn't had any sx    Compliant with meds          Relevant Medications    lisinopril-hydrochlorothiazide (ZESTORETIC) 20-12.5 MG tablet    atenolol (TENORMIN) 25 MG tablet    Breast pain, left     X 2 weeks.  May be breast or just lateral to breast and just distal to axilla.  Significant ttp on exam but no masses, nipple discharge, rash appreciated in area of tenderness.  Never completed screening mammo when ordered in June 2019.  She does lift her 35 mos old grandson who continues to gain weight.    amenorrhea thus no menses in last 1-2 yrs.  May just be muscle strain.   -L breast/axillary [place]  -For now, schedule screening mammo, may need to change to dx  -Wear looser bra  -Other supportive care for pain management   -gyn referral for amenorrhea   -Strict return precautions given.    Resolved- thought msk from carryin grandson   Never ended up getting imaging  Relevant Orders    US Breast Limited - Left        GI and Hepatology    Fatty liver     Transaminitis 04/2018. Never completed abd US. Will sched US. She has had other fatty liver work up that was neg. She is on metformin + statin. Asymptomatic. Strict return precautions given.      repeat labs  today  Never did US         Relevant Orders    Comprehensive Metabolic Panel Green        Endocrine    Type 2 diabetes mellitus without complication, with long-term current use of insulin (CMS-HCC)     A1C 6.8 04/2018.  We had increased metformin to 1000 BID, and in turn pt decreased her novolin 70/30 from 50 units BID to 40 units BID on her own. - met 1000 BID, novilin 50 BID   Home BS unknown. Not doing this - was too expensive   Repeat A1C today.- 7.3 10/2018   Due for UACR.- normal 10/2018    Never schedule ophtho exam, print referral. - hasn't   Never saw podiatry for foot pain, but normal monofilament exam 04/2018. Print referral. - not as bothersome              Relevant Medications    metFORMIN (GLUCOPHAGE) 1000 MG tablet    Other Relevant Orders    Glycosylated Hgb(A1C), Blood Lavender    Random Urine Microalb/Creat Ratio Panel        Other    Amenorrhea     LMP 2 yrs go.  Advised by previous physician that this is related to her obesity.  Offered progesterone but patient declined, now considering.  04/2018 TSH, prolactin normal.   Declines upreg.   Consider estrogen, progesterone, LH, FSH in future.  No prior TVUS, ordered 04/2018, never completed, advised to schedule.  Refer gyn.    Still no period   Never went b/c of covid pandemic          Relevant Orders    [facility]    Obesity, unspecified classification, unspecified obesity type, unspecified whether serious comorbidity present     Lost 16 lbs since June 2019 but due to decreased appetite from stress over last 1-2 mos. She also had abd pain with all foods that she also thought was stress related. Has resolved. Eval further if recurs. Never saw bariatric surgery, print referral. Exc minimal- dog walking. Still has foot pain and never saw podiatry. Will print referral.   CTM.     Body mass index is 50.11 kg/m.  Date Weight Recorded 05/04/2019 10/19/2018 05/20/2018   Metric 152.817 kg 157.398 kg 119.296 kg   Pounds/Ounces 336 lb 14.4 oz 347 lb 263 lb    She actually lost about 30 lbs in total since nov  Not exc as much since march but eating much healthier            Need for vaccination    Relevant Orders    Flu Vaccine, IM, >= 6 Months (Completed)         ROS   (Check if Normal, or note + findings):   [x] 14-point ROS performed and is negative except as per HPI.    [] Not Obtainable:   [] Constit  [] Skin    [] Eyes  [] CV   [] MS    [] ENT  [] GI  [] Heme/Lymph     []   Resp  [] GU  [] Neuro    []  Imm/All   [] Endo  [] Psych     ALL   No Known Allergies  MEDS     Current Outpatient Medications   Medication Sig Dispense Refill   . albuterol 108 (90 Base) MCG/ACT inhaler Inhale 2 puffs by mouth every 6 hours as needed for Wheezing or Shortness of Breath. 1 Inhaler 3   . atenolol (TENORMIN) 25 MG tablet Take 1 tablet by mouth once daily 90 tablet 0   . buPROPion (WELLBUTRIN XL) 150 MG XL tablet Take 1 tablet (150 mg) by mouth daily. 90 tablet 8   . Continuous Blood Gluc Receiver (CONTINUOUS BLOOD GLUCOSE, FREESTYLE LIBRE, 14 DAY READER) Use TID to read glucose topically. 1 Device 0   . Continuous Blood Gluc Sensor (CONTINUOUS BLOOD GLUCOSE, FREESTYLE LIBRE 14 DAY, 14-DAY SENSOR) Insert subcutaneously every 14 days. 2 each 11   . famotidine (PEPCID) 40 MG tablet Take 1 tablet (40 mg) by mouth 2 times daily. 180 tablet 8   . ibuprofen (MOTRIN) 400 MG tablet Take 1 to 2 tablets by mouth 3 times a day as needed for pain     . insulin NPH-insulin regular (HUMULIN 70/30) (70-30) 100 UNIT/ML injection Inject 50 Units under the skin 2 times daily. 10 mL 3   . lisinopril-hydrochlorothiazide (ZESTORETIC) 20-12.5 MG tablet Take 2 tablets by mouth once daily 180 tablet 0   . MAGNESIUM GLYCINATE PLUS PO      . metFORMIN (GLUCOPHAGE) 1000 MG tablet Take 1 tablet (1,000 mg) by mouth 2 times daily (with meals). 180 tablet 0   . niacin (NIASPAN) 500 MG ER tablet Take 500 mg by mouth every evening.     . simvastatin (ZOCOR) 40 MG tablet Take 1 tablet (40 mg) by mouth every evening. 90 tablet 0     No current facility-administered medications for this visit.      PMH     Past Medical History:   Diagnosis Date   . Allergic rhinitis    . Amenorrhea 05/20/2018   . Anxiety and depression    . Asthma    . Chronic venous insufficiency    . Encounter for tobacco use cessation counseling 05/20/2018   . Fatty liver 05/20/2018   . Hyperlipidemia, unspecified hyperlipidemia type 05/20/2018   . Hypertension, unspecified type 05/20/2018   .  Intractable right heel pain 05/20/2018   . Obesity, unspecified classification, unspecified obesity type, unspecified whether serious comorbidity present 05/20/2018   . Routine health maintenance 05/20/2018   . Skin tags, multiple acquired    . Type 2 diabetes mellitus without complication, with long-term current use of insulin (CMS-HCC) 05/20/2018     PSxH     Past Surgical History:   Procedure Laterality Date   . NO PAST SURGERIES       [facility] AND [facility]     Family History   Problem Relation Name Age of Onset   . Diabetes Mother     . No Known Problems Father     . Diabetes Maternal Grandmother     . No Known Problems Maternal Grandfather     . No Known Problems Paternal Grandmother     . No Known Problems Paternal Grandfather       Social History     Socioeconomic History   . Marital status: Single     Spouse name: Not on file   . Number of children: Not on file   .  Years of education: Not on file   . Highest education level: Not on file   Occupational History   . Not on file   Tobacco Use   . Smoking status: Light Tobacco Smoker     Years: 10.00   . Smokeless tobacco: Never Used   . Tobacco comment: About 1 pack a week, only when drinking alcohol    Substance and Sexual Activity   . Alcohol use: Yes     Frequency: 2-4 times a month   . Drug use: Not Currently   . Sexual activity: Yes     Partners: Male     Comment: same female partner x 10 yrs    Social Activities of Daily Living Present   . Not on file   Social History Narrative   . Not on file     PHYSICAL EXAM   Check if Normal, or note + findings  Vit BP 137/84 (BP Location: Left arm, BP Patient Position: Sitting, BP cuff size: Large)   Pulse 71   Temp 98.2 F (36.8 C) (Oral)   Ht 5' 8.75" (1.746 m)   Wt 152.8 kg (336 lb 14.4 oz)   SpO2 96%   BMI 50.11 kg/m    Vitals:    05/04/19 1005 05/04/19 1042   BP: 137/84    BP Location: Left arm    BP Patient Position: Sitting    BP cuff size: Large    Pulse: 71    Temp: 99 F (37.2 C) 98.2 F (36.8 C)   TempSrc: Oral  Oral   SpO2: 96%    Weight: 152.8 kg (336 lb 14.4 oz)    Height: 5' 8.75" (1.746 m)       Gen [x] NAD, obese  GI [] Abd  [] Rect [] HSM   Eyes [x] Conj/Lids [] Pupils  [] Masses [] Guard/Rebnd   ENT [] Ears/otosc    [] Oroph   [] Nasal Muc  [x] Hearing: grossly intact  GU:Fem  GU:Female [] Ext   [] Vag              [] Pen  [] Scro    [] Cerv   [] Uter/Ad:    [] Deferred    [] Prost [] Deferred   Neck [] Inspect/Palp   [] Thyroid Neuro [x] A&O [x] CN2-12: grossly intact    Breast [] Inspect   [] Palpation:   [] DTR [] Motor/Sen   Resp [x] Effort [x] Ascult [] Percuss MSK [x] Gait   [] ROM  [] Tone  [] Bal [] Insp/palp:    [] Back   CV [x] Auscultate [] Carotids       [x] Edema Puls: [x] Pedal [] Fem  Skin [x] Inspect -multiple skin tags-neck  B foot/ankle hyperpigmentatio    (both unchanged from prior)  [x] Palp   Lymph [] Neck  []   []  Femoral Psych [x] Insight/judg  [x] Affect [x] Cog     LABS/STUDIES     Personally reviewed by me.     Assessment and Plan:   Danielle [name] is a very pleasant 47 year old female   has a past medical history of Allergic rhinitis, Amenorrhea (05/20/2018), Anxiety and depression, Asthma, Chronic venous insufficiency, Encounter for tobacco use cessation counseling (05/20/2018), Fatty liver (05/20/2018), Hyperlipidemia, unspecified hyperlipidemia type (05/20/2018), Hypertension, unspecified type (05/20/2018), Intractable right heel pain (05/20/2018), Obesity, unspecified classification, unspecified obesity type, unspecified whether serious comorbidity present (  05/20/2018), Routine health maintenance (05/20/2018), Skin tags, multiple acquired, and Type 2 diabetes mellitus without complication, with long-term current use of insulin (CMS-HCC) (05/20/2018)., new, establish care, discuss:     Problem List Items Addressed This Visit        Cardiovascular    Hypertension, unspecified type     Mildly elevated today.  Not checking at home.  Asymptomatic.  Compliant with her meds -  lisinopril-hctz  20/12.5 2 tabs daily, atenolol 25 daily.   Check bmp, mg.   Strict return precautions given.           Relevant Orders    Magnesium, Blood Green Plasma Separator Tube    Hyperlipidemia, unspecified hyperlipidemia type - Primary     Nl 10/2018.  DM II.  Compliant with simvastatin 40 daily.  Started niacin in last few mos 2/2 chornic leg cramping and helping.  Sig improvement in diet last 3 months resulting in approx 30 lbs wt loss since nov 2019.   Not exercising.  -repeat FLP given fasting and having other labs done today.  -cont healthy diet  -start exc          Relevant Medications    simvastatin (ZOCOR) 40 MG tablet    Other Relevant Orders    Lipid Panel Green Plasma Separator Tube    Breast pain, left     Resolved so never had mammo/US completed.  Was likely muscle strain from carrying her grandson.  CTM.  Strict return precautions given.              GI and Hepatology    Fatty liver     Never completed abd [place] as concerned regarding COVID pandemic.  -cbc, cmp today  -encouraged to schedule  abd [place]          Relevant Orders    CBC w/ Diff Lavender    Comprehensive Metabolic Panel Green       Endocrine    Type 2 diabetes mellitus without complication, with long-term current use of insulin (CMS-HCC)     A1C uptrending 6.8 04/2018-->7.3 10/2018.  Not checking Home BS.  metf 1000 BID, her novolin 70/30 from 50 units BID.  Sig improvement in diet last 3 mos, +wt loss, but no exc.  Never scheduled ophtho or podiatry appts.  -due for a1c, uacr  -CGM too expensive for pt; after A1C resulted will come up with better accucheck regimen for her   -given # to sched ophtho, podiatry appts   -cont healthy diet  -start exc  -Strict return precautions given.           Relevant Medications    metFORMIN (GLUCOPHAGE) 1000 MG tablet    Other Relevant Orders    Glycosylated Hgb(A1C), Blood Lavender       Behavioral Health    Anxiety and depression     Was having panic attacks at beg of covid pandemic but improved with stopping watching  the news and lavender/mg supplement. Still on wellbutrin 150 xl daily. Cont to decline therapy.Strict return precautions given.              Other    Routine health maintenance    Relevant Orders    TSH, Blood - See Instructions    Obesity, unspecified classification, unspecified obesity type, unspecified whether serious comorbidity present     Body mass index is 50.11 kg/m.  Date Weight Recorded 05/04/2019 10/19/2018 05/20/2018   Metric 152.817 kg 157.398 kg 119.296 kg   Pounds/Ounces  336 lb 14.4 oz 347 lb 263 lb    See "HL"            Intractable right heel pain     Sig improved but dull ache or numbness feeling. Monofilament testing normal. Never sched w/ podiatry, given # today. Strict return precautions given.           Amenorrhea     Still has not had a period for at least 3yrs.  Never completed pelvic US or sched with gyn.  Encouraged sched pelvic US and then f/u with gyn, given # for clinic.  Strict return precautions given.                 #RTC: Return in about 3 months (around 08/04/2019) for 40 min F2F f/u .    The above recommendation were discussed with the patient.  The patient has all questions answered satisfactorily and is in agreement with this recommended plan of care.    I spent 25 min with the patient this visit, more than 50% of which was spent counseling/coordinating care regarding listed above.     Amruti Borad, D.O., 05/20/2018 3:32 PM  Clinical Practice Organization  [facility], [facility]  [address]  [place], [place] [address]

## 2019-05-04 NOTE — Assessment & Plan Note (Signed)
Never completed abd [place] as concerned regarding COVID pandemic.  -cbc, cmp today  -encouraged to schedule  abd [place]

## 2019-05-08 ENCOUNTER — Encounter (INDEPENDENT_AMBULATORY_CARE_PROVIDER_SITE_OTHER): Payer: Self-pay | Admitting: Family Practice

## 2019-06-21 ENCOUNTER — Other Ambulatory Visit (INDEPENDENT_AMBULATORY_CARE_PROVIDER_SITE_OTHER): Payer: Self-pay | Admitting: Family Practice

## 2019-06-21 DIAGNOSIS — F32A Depression, unspecified: Secondary | ICD-10-CM

## 2019-06-21 DIAGNOSIS — I10 Essential (primary) hypertension: Secondary | ICD-10-CM

## 2019-06-22 MED ORDER — LISINOPRIL-HYDROCHLOROTHIAZIDE 20-12.5 MG OR TABS
ORAL_TABLET | ORAL | 0 refills | Status: DC
Start: 2019-06-22 — End: 2019-09-25

## 2019-06-22 MED ORDER — ATENOLOL 25 MG OR TABS
ORAL_TABLET | ORAL | 0 refills | Status: DC
Start: 2019-06-22 — End: 2019-09-25

## 2019-06-22 MED ORDER — BUPROPION XL (DAILY) 150 MG OR TB24
ORAL_TABLET | ORAL | 0 refills | Status: DC
Start: 2019-06-22 — End: 2019-09-25

## 2019-07-31 ENCOUNTER — Encounter (INDEPENDENT_AMBULATORY_CARE_PROVIDER_SITE_OTHER): Payer: Self-pay | Admitting: Hospital

## 2019-08-03 ENCOUNTER — Encounter (INDEPENDENT_AMBULATORY_CARE_PROVIDER_SITE_OTHER): Payer: No Typology Code available for payment source | Admitting: Family Practice

## 2019-08-04 ENCOUNTER — Other Ambulatory Visit (INDEPENDENT_AMBULATORY_CARE_PROVIDER_SITE_OTHER): Payer: Self-pay | Admitting: Family Practice

## 2019-08-04 DIAGNOSIS — E119 Type 2 diabetes mellitus without complications: Secondary | ICD-10-CM

## 2019-08-04 MED ORDER — METFORMIN HCL 1000 MG OR TABS
1000.0000 mg | ORAL_TABLET | Freq: Two times a day (BID) | ORAL | 0 refills | Status: DC
Start: 2019-08-04 — End: 2019-10-24

## 2019-08-04 NOTE — Telephone Encounter (Signed)
Prescription Refill Request     Incoming fax refill request from pharmacy   Name of PCP Provider: Carmelina Dane   Last office visit: 05/04/2019  Next office visit: Visit date not found    No Known Allergies    Requested Medication/s:   Current Outpatient Medications   Medication Sig Dispense Refill    metFORMIN (GLUCOPHAGE) 1000 MG tablet Take 1 tablet (1,000 mg) by mouth 2 times daily (with meals). 180 tablet 0           Patient has made an attempt to refill with pharmacy? Unknown    Send to:     [facility]  [address]  [place], [place] [address]  Phone: [phone] Fax: [phone]

## 2019-08-10 ENCOUNTER — Other Ambulatory Visit: Payer: Self-pay

## 2019-08-10 NOTE — Telephone Encounter (Signed)
Patient needs referral to Population Health Disease Management Team for referrals/concerns:      Professor routed to [employer] Condition Management Program.

## 2019-08-10 NOTE — Telephone Encounter (Signed)
Student Nurse Outreach   Population Health Services Organization: Hypertension Health and Wellness Program     S: Danielle [name] is a 48 year old female who I have contacted regarding:  hypertension related to the Hypertension Health and [place]. Telephonic call placed to inform of eligibility for disease management services.    B: Confirmed patient with diagnosis of Hypertension. Patient was identified from an Epic generated report using disease registry.    A: Most recent BP vitals of  on 137/84 05/04/2019  Had discussion with patient regarding Hypertension management, reports the following: Patient feels her blood pressure is controlled and she can tell when it is elevated. Patient states she needs to be better about walking and eating healthier but has been doing better since she has been working from home.    [facility] Rein D MTM Program  Patient eligible and agreeable for Pharm D medication therapy management services for HTN medications. no    Remote Patient Monitoring Digital Program  Patient eligible and agreeable to receive a digital device. no  Has qualifying smartphone no  Type of Smartphone:     Ag[employer] Condition Management Program  Patient elected to enroll Agile texting program. yes    R: Routing encounter to Professor  for review.  Escalation required no  Reason for Escalation:

## 2019-08-21 ENCOUNTER — Encounter: Payer: Self-pay | Admitting: Hospital

## 2019-08-31 ENCOUNTER — Other Ambulatory Visit (INDEPENDENT_AMBULATORY_CARE_PROVIDER_SITE_OTHER): Payer: Self-pay | Admitting: Family Practice

## 2019-08-31 DIAGNOSIS — E785 Hyperlipidemia, unspecified: Secondary | ICD-10-CM

## 2019-09-01 MED ORDER — SIMVASTATIN 40 MG OR TABS
ORAL_TABLET | ORAL | 0 refills | Status: DC
Start: 2019-09-01 — End: 2019-09-28

## 2019-09-01 NOTE — Telephone Encounter (Addendum)
Disregard duplicate request    Thank You,  Curlene Labrum Assistant]

## 2019-09-01 NOTE — Telephone Encounter (Signed)
Refill Request Received from patient.   Please see orders for preloaded prescription refill of Simvastatin.     Last visit with this physician:                             05/04/2019    Next appointment with this physician:              Visit date not found [appt 9/3 was cancelled]

## 2019-09-04 NOTE — Telephone Encounter (Signed)
Patient disenrolled self from [employer] Condition Management Program. Will close case at this time.

## 2019-09-24 ENCOUNTER — Other Ambulatory Visit (INDEPENDENT_AMBULATORY_CARE_PROVIDER_SITE_OTHER): Payer: Self-pay | Admitting: Family Practice

## 2019-09-24 DIAGNOSIS — F32A Depression, unspecified: Secondary | ICD-10-CM

## 2019-09-24 DIAGNOSIS — I10 Essential (primary) hypertension: Secondary | ICD-10-CM

## 2019-09-25 MED ORDER — ATENOLOL 25 MG OR TABS
ORAL_TABLET | ORAL | 1 refills | Status: DC
Start: 2019-09-25 — End: 2020-03-18

## 2019-09-25 MED ORDER — LISINOPRIL-HYDROCHLOROTHIAZIDE 20-12.5 MG OR TABS
ORAL_TABLET | ORAL | 1 refills | Status: DC
Start: 2019-09-25 — End: 2020-03-18

## 2019-09-25 MED ORDER — BUPROPION XL (DAILY) 150 MG OR TB24
ORAL_TABLET | ORAL | 1 refills | Status: DC
Start: 2019-09-25 — End: 2020-03-18

## 2019-09-28 ENCOUNTER — Other Ambulatory Visit (INDEPENDENT_AMBULATORY_CARE_PROVIDER_SITE_OTHER): Payer: Self-pay | Admitting: Family Practice

## 2019-09-28 DIAGNOSIS — E785 Hyperlipidemia, unspecified: Secondary | ICD-10-CM

## 2019-09-28 MED ORDER — SIMVASTATIN 40 MG OR TABS
40.0000 mg | ORAL_TABLET | Freq: Every evening | ORAL | 0 refills | Status: DC
Start: 2019-09-28 — End: 2019-10-24

## 2019-10-24 ENCOUNTER — Encounter (INDEPENDENT_AMBULATORY_CARE_PROVIDER_SITE_OTHER): Payer: Self-pay | Admitting: Family Practice

## 2019-10-24 ENCOUNTER — Other Ambulatory Visit (INDEPENDENT_AMBULATORY_CARE_PROVIDER_SITE_OTHER): Payer: Self-pay | Admitting: Family Practice

## 2019-10-24 DIAGNOSIS — E785 Hyperlipidemia, unspecified: Secondary | ICD-10-CM

## 2019-10-24 DIAGNOSIS — E119 Type 2 diabetes mellitus without complications: Secondary | ICD-10-CM

## 2019-10-24 MED ORDER — METFORMIN HCL 1000 MG OR TABS
1000.0000 mg | ORAL_TABLET | Freq: Two times a day (BID) | ORAL | 0 refills | Status: DC
Start: 2019-10-24 — End: 2020-02-07

## 2019-10-24 MED ORDER — SIMVASTATIN 40 MG OR TABS
40.0000 mg | ORAL_TABLET | Freq: Every evening | ORAL | 0 refills | Status: DC
Start: 2019-10-24 — End: 2020-02-07

## 2019-10-24 NOTE — Telephone Encounter (Signed)
Original rx refill encounter has been routed to provider for review/response    Closing duplicate encounter    Thank You,  Curlene Labrum Assistant]

## 2019-10-24 NOTE — Telephone Encounter (Signed)
From: Lavena Stanford  To: Carmelina Dane, DO  Sent: 10/24/2019 8:59 AM PST  Subject: 20-Other    Good Morning,  I just requested a renewal rx for Metformin and Simvastantin, I did not pick it up from [facility] last month and I wanted to xfer the rx over to [facility]. I am completely out now and would like to pick up tomorrow if possible. Also, both requests are for 90 day supply, thank you! Have a good Thanksgiving!

## 2019-11-27 ENCOUNTER — Encounter (INDEPENDENT_AMBULATORY_CARE_PROVIDER_SITE_OTHER): Payer: Self-pay | Admitting: Family Practice

## 2019-11-27 NOTE — Telephone Encounter (Signed)
From: Lavena Stanford  To: Carmelina Dane, DO  Sent: 11/27/2019 4:14 PM PST  Subject: 2-Procedural Question    Hello, I was wondering if you will be notifying patients when it's their turn to receive the COVID-19 vaccine? I am supposed to be in Phase 1-C. Is there a website where I can check to see when it's time?

## 2019-12-14 ENCOUNTER — Encounter: Payer: Self-pay | Admitting: Hospital

## 2020-02-07 ENCOUNTER — Encounter (INDEPENDENT_AMBULATORY_CARE_PROVIDER_SITE_OTHER): Payer: Self-pay | Admitting: Family Practice

## 2020-02-07 ENCOUNTER — Other Ambulatory Visit (INDEPENDENT_AMBULATORY_CARE_PROVIDER_SITE_OTHER): Payer: Self-pay | Admitting: Family Practice

## 2020-02-07 DIAGNOSIS — Z794 Long term (current) use of insulin: Secondary | ICD-10-CM

## 2020-02-07 DIAGNOSIS — E785 Hyperlipidemia, unspecified: Secondary | ICD-10-CM

## 2020-02-07 NOTE — Telephone Encounter (Signed)
From: Alric Ran  To: Aurelio Brash, DO  Sent: 02/07/2020 2:26 PM PST  Subject: 6-General Complaint    Good Afternoon,  I was wondering if you are vaccinating yet? Or should I go to another location? I am eligible as of 3/15 and am trying to make my appt early because I have to return back to work on 3/29.

## 2020-02-08 MED ORDER — METFORMIN HCL 1000 MG OR TABS
1000.0000 mg | ORAL_TABLET | Freq: Two times a day (BID) | ORAL | 0 refills | Status: DC
Start: 2020-02-08 — End: 2020-05-09

## 2020-02-08 MED ORDER — SIMVASTATIN 40 MG OR TABS
40.0000 mg | ORAL_TABLET | Freq: Every evening | ORAL | 0 refills | Status: DC
Start: 2020-02-08 — End: 2020-05-09

## 2020-02-09 ENCOUNTER — Encounter (INDEPENDENT_AMBULATORY_CARE_PROVIDER_SITE_OTHER): Payer: Self-pay | Admitting: Hospital

## 2020-02-14 ENCOUNTER — Encounter (INDEPENDENT_AMBULATORY_CARE_PROVIDER_SITE_OTHER): Payer: Self-pay | Admitting: Hospital

## 2020-02-14 ENCOUNTER — Encounter (INDEPENDENT_AMBULATORY_CARE_PROVIDER_SITE_OTHER): Payer: Self-pay

## 2020-02-19 ENCOUNTER — Encounter (INDEPENDENT_AMBULATORY_CARE_PROVIDER_SITE_OTHER): Payer: Self-pay | Admitting: Family Practice

## 2020-02-19 NOTE — Telephone Encounter (Signed)
From: Alric Ran  To: Aurelio Brash, DO  Sent: 02/19/2020 5:09 AM PDT  Subject: 20-Other    Good morning, I received your email about wanting to see me before my next rx renewal, I will be making my appointment after my 2nd Covid vaccine that is scheduled for April 9th. I prefer to be fully vaccinated before I visit a medical facility. I also was writing to request a mild sleeping aid. I have tried melatonin and other natural supplements but I keep waking up around 2 am and can't go back to sleep until it's almost time for me to get up for work. I do have to wake up by 4:30 am, so I don't want anything that will have me feeling groggy in the morning, I just want to sleep the whole night through. If Dr. Benjamine Sprague approves this, please send to [facility] at [address] in [place], [phone]. Thank you

## 2020-02-19 NOTE — Telephone Encounter (Signed)
Dr Denman George is OOO until 3/24  Routing to Doc of the Day for review/response.     Received MyChart Message from pt stating "I also was writing to request a mild sleeping aid. I have tried melatonin and other natural supplements but I keep waking up around 2 am and can't go back to sleep until it's almost time for me to get up for work. I do have to wake up by 4:30 am, so I don't want anything that will have me feeling groggy in the morning, I just want to sleep the whole night through. If Dr. Benjamine Sprague approves this, please send to [facility] at [address] in [place], [phone]. Thank you"      Thank You,  Danielle [name] Assistant]

## 2020-02-27 ENCOUNTER — Encounter (INDEPENDENT_AMBULATORY_CARE_PROVIDER_SITE_OTHER): Payer: Self-pay | Admitting: Family Medicine

## 2020-02-27 DIAGNOSIS — Z794 Long term (current) use of insulin: Secondary | ICD-10-CM

## 2020-03-15 ENCOUNTER — Encounter (INDEPENDENT_AMBULATORY_CARE_PROVIDER_SITE_OTHER): Payer: No Typology Code available for payment source | Admitting: Family Practice

## 2020-03-18 ENCOUNTER — Encounter (INDEPENDENT_AMBULATORY_CARE_PROVIDER_SITE_OTHER): Payer: Self-pay | Admitting: Family Practice

## 2020-03-18 ENCOUNTER — Telehealth (INDEPENDENT_AMBULATORY_CARE_PROVIDER_SITE_OTHER): Payer: No Typology Code available for payment source | Admitting: Family Practice

## 2020-03-18 ENCOUNTER — Encounter (INDEPENDENT_AMBULATORY_CARE_PROVIDER_SITE_OTHER): Payer: Self-pay

## 2020-03-18 ENCOUNTER — Other Ambulatory Visit: Payer: Self-pay

## 2020-03-18 ENCOUNTER — Encounter (INDEPENDENT_AMBULATORY_CARE_PROVIDER_SITE_OTHER): Payer: Self-pay | Admitting: Hospital

## 2020-03-18 DIAGNOSIS — I10 Essential (primary) hypertension: Secondary | ICD-10-CM

## 2020-03-18 DIAGNOSIS — F329 Major depressive disorder, single episode, unspecified: Secondary | ICD-10-CM

## 2020-03-18 DIAGNOSIS — E669 Obesity, unspecified: Secondary | ICD-10-CM

## 2020-03-18 DIAGNOSIS — F419 Anxiety disorder, unspecified: Secondary | ICD-10-CM

## 2020-03-18 DIAGNOSIS — E785 Hyperlipidemia, unspecified: Secondary | ICD-10-CM

## 2020-03-18 DIAGNOSIS — N912 Amenorrhea, unspecified: Secondary | ICD-10-CM

## 2020-03-18 DIAGNOSIS — Z794 Long term (current) use of insulin: Secondary | ICD-10-CM

## 2020-03-18 DIAGNOSIS — K760 Fatty (change of) liver, not elsewhere classified: Secondary | ICD-10-CM

## 2020-03-18 DIAGNOSIS — Z1239 Encounter for other screening for malignant neoplasm of breast: Secondary | ICD-10-CM

## 2020-03-18 DIAGNOSIS — E119 Type 2 diabetes mellitus without complications: Secondary | ICD-10-CM

## 2020-03-18 MED ORDER — PROBIOTIC-PREBIOTIC PO: ORAL | Status: AC

## 2020-03-18 MED ORDER — ATENOLOL 25 MG OR TABS
25.0000 mg | ORAL_TABLET | Freq: Every day | ORAL | 0 refills | Status: DC
Start: 2020-03-18 — End: 2020-06-24

## 2020-03-18 MED ORDER — BUPROPION XL (DAILY) 150 MG OR TB24
150.0000 mg | ORAL_TABLET | Freq: Every day | ORAL | 0 refills | Status: DC
Start: 2020-03-18 — End: 2020-06-24

## 2020-03-18 MED ORDER — NATURAL SUPPLEMENT: Status: AC

## 2020-03-18 MED ORDER — LISINOPRIL-HYDROCHLOROTHIAZIDE 20-12.5 MG OR TABS
2.0000 | ORAL_TABLET | Freq: Every day | ORAL | 0 refills | Status: DC
Start: 2020-03-18 — End: 2020-06-24

## 2020-03-18 NOTE — Assessment & Plan Note (Signed)
05/2019 +transaminitis  Never completed abd [place]   -ck cbc, lfts  -reorder abd [place], # given to schedule

## 2020-03-18 NOTE — Patient Instructions (Addendum)
Complete your fasting labs within the next 1 month  Lab and Blood Drawing Services    Below is a list of [facility] locations where patients can have blood drawn for laboratory testing. Please note that some locations require appointments and cannot accommodate walk-in patients.      [place] [facility] [place]   [place] [facility] [facility]  [address]  [place], [place] [address]  [phone]  Monday-Friday: 7:30 a.m. to 5 p.m.  Sunday 7:30 a.m. to 10:30 a.m.   Closed on Saturdays     [facility] [place]   [address] and [facility]  [address], [address]  [place], [place] [address]  [phone]  Monday-Friday: 7:30 a.m. to 5 p.m.  *Patients are welcome to visit the lab in the main hospital:   [address]     [place] [facility]  [address]   [place], [place] [address]  [phone]  Monday-Friday: 8 a.m. to 4:30 p.m.  *Appointment Required     [place]  [address]  [place], [place] [address]  [phone]   Monday-Friday: 8:30 a.m. to 4:30 p.m.   *Appointment Required     [place] [facility] - [address]  [address] Floor  [place], [place] [address]  [phone]   Monday-Friday: 8:30 a.m. to 4 p.m.   *Appointment Required     [place] [facility]  [address]  [place], [place] [address]  [phone]  Monday-Friday: 7:30 a.m. to 6 p.m.    [facility] [place]  [address]  [place], [place] [address]   [phone]  Monday-Friday: 8 a.m. - 8 p.m., Sat-Sun: 8 a.m. - 12 p.m.  *Last patient check-in at 7:30 p.m.     [facility]  [address]  [place], [place] [address]  [phone]  Monday-Friday: 7:30 a.m. to 5 p.m.  *Appointment Required     [place]  [facility]  [address]  [place], [place] [address]  [phone]  Monday-Friday: 7:30 a.m. to 5 p.m.    [facility] - [facility]  [address]  [address]  [place], [place] [address]  [phone]  Monday-Friday: 8 a.m. to 4:30 p.m.  Closed for lunch 12:30 to 1 p.m.     [place]  [address]  [place], [place] [address]  [phone]  Monday-Friday: 8 to 11:30 a.m. and 1 to 4 p.m.  Closed for lunch 11:30 a.m. to 1 p.m     [place] [facility]   [address]  [place], [place] [address]  [phone]   Monday-Friday: 8:30 a.m. to 4 p.m.   *Appointment Required       Send me photos of your supplements (I would like to review ingredients)     Send me photos of your COVID vaccine card     Let me know what other medications you need refilled  Schedule your abdominal ultrasound and pelvic ultrasound: [phone]  Or via mychart     The eye clinic will call you to schedule a diabetic eye exam     Please obtain a blood pressure monitor with an arm cuff. Check your blood pressure and heart rate three times per day for 7 days and then send it to me via mychart

## 2020-03-18 NOTE — Telephone Encounter (Signed)
Updated in med list and other enc routed to Dr. Denman George.

## 2020-03-18 NOTE — Assessment & Plan Note (Signed)
Still no menses x almost 4 yrs now  Never completed pelvic US or see gyn   -reorder pelvic US, #given to schedfule  -pt would like to hold off on gyn referral until [place] resulted  -Strict return precautions given.

## 2020-03-18 NOTE — Telephone Encounter (Addendum)
3 Supplements updated in reported medications.   Message forwarded to Dr. Denman George for review.  Thank you.    Danielle [name]  to Aurelio Brash, DO       03/18/20 1:46 PM  Hi Dr. Denman George, here is one of the supplements I am taking, this one is for improved blood circulation.      Danielle [name]  to Aurelio Brash, DO       03/18/20 1:47 PM  Hi Dr. Denman George, this is another one for blood sugar and blood pressure.

## 2020-03-18 NOTE — Telephone Encounter (Signed)
From: Alric Ran  To: Aurelio Brash, DO  Sent: 03/18/2020 1:48 PM PDT  Subject: Eleonore Chiquito. Borad I am sending you the pics I f the supplements I am taking per your request. This one is a Prebiotic-Probiotic.

## 2020-03-18 NOTE — Assessment & Plan Note (Signed)
Not checking at home  Compliant with atenolol 25 daily + lis/hctz 20/12.5 2 tabs daily  No sx  05/2019 bmp normal  -obtain bp monitor with arm cuff, log x 7 days and send via mychart  -refill meds  -repeat bmp, check mg  -Strict return precautions given.

## 2020-03-18 NOTE — Telephone Encounter (Signed)
From: Alric Ran  To: Aurelio Brash, DO  Sent: 03/18/2020 1:46 PM PDT  Subject: 20-Other    Hi Dr. Denman George, here is one of the supplements I am taking, this one is for improved blood circulation.

## 2020-03-18 NOTE — Assessment & Plan Note (Signed)
Improving  Refill wellbutrin 150 xl daily   Strict return precautions given.

## 2020-03-18 NOTE — Telephone Encounter (Signed)
Updated in med list and other enc routed to Dr. Borad.

## 2020-03-18 NOTE — Telephone Encounter (Signed)
From: Alric Ran  To: Aurelio Brash, DO  Sent: 03/18/2020 1:50 PM PDT  Subject: 20-Other    Hi Dr. Denman George per your request attached is my CDC Covid vaccination records. I went to [facility] for both of my shots.

## 2020-03-18 NOTE — Assessment & Plan Note (Signed)
See "DM" and "HL"

## 2020-03-18 NOTE — Assessment & Plan Note (Signed)
Lab Results   Component Value Date    A1C 6.6 (H) 05/04/2019    A1C 7.3 (H) 11/13/2018    A1C 6.8 (H) 05/20/2018   At goal (<7)  Not checking home bs, cgm too expensive  Compliant with metformin 1000 bid, novolin 70/30 from 50 units BID.  Trying to eat healthy  No specific exc regimen, but less sedentary  Not monitoring wt   Never went to ophtho  heel pain improved, so never went to podiatrist  -check a1c, uacr, b12; depending on results, discuss frequency of bs monitoring   -refer ophtho given prior ref expire  -hold off on podiatry eval given heel pain improved  -cont meds   -diet/exc recs   -foot exam at f/u   -discuss rest at f/u   -Strict return precautions given.

## 2020-03-18 NOTE — Telephone Encounter (Signed)
From: Alric Ran  To: Aurelio Brash, DO  Sent: 03/18/2020 1:47 PM PDT  Subject: 20-Other    Hi Dr. Denman George, this is another one for blood sugar and blood pressure.

## 2020-03-18 NOTE — Assessment & Plan Note (Signed)
Lab Results   Component Value Date    CHOL 180 05/04/2019    HDL 62 05/04/2019    LDLCALC 92 05/04/2019    TRIG 130 05/04/2019   The 10-year ASCVD risk score Denman George DC Jr., et al., 2013) is: 6%    Values used to calculate the score:      Age: 49 years      Sex: Female      Is Non-Hispanic African American: No      Diabetic: Yes      Tobacco smoker: Yes      Systolic Blood Pressure: 137 mmHg      Is BP treated: Yes      HDL Cholesterol: 62 mg/dL      Total Cholesterol: 180 mg/dL  On simva 40 daily but stopped niacin 500 xl daily  Trying to work on diet  No exc regimen, but less sedentary  Not monitoring wt  -repeat FLP  -cont simva 40 daily   -diet/exc recs

## 2020-03-18 NOTE — Progress Notes (Signed)
---------------------(data below generated by Ryla Cauthon Olin Hauserurlabh Quayshawn Nin, DO)--------------------     Patient Verification & Telemedicine Consent:    I am proceeding with this evaluation at the direct request of the patient.  I have verified this is the correct patient and have obtained verbal consent and written consent from the patient/ surrogate to perform this voluntary telemedicine evaluation (including obtaining history, performing examination and reviewing data provided by the patient).   The patient/ surrogate has the right to refuse this evaluation.  I have explained risks (including potential loss of confidentiality), benefits, alternatives, and the potential need for subsequent face to face care. Patient/ surrogate understands that there is a risk of medical inaccuracies given that our recommendations will be made based on reported data (and we must therefore assume this information is accurate).  Knowing that there is a risk that this information is not reported accurately, and that the telemedicine video, audio, or data feed may be incomplete, the patient agrees to proceed with evaluation and holds us harmless knowing these risks. In this evaluation, we will be providing recommendations only.  The ultimate decision to follow, or not follow, these recommendations will be left to the bedside treating/ requesting practitioner.  The patient/ surrogate has been notified that other healthcare professionals (including students, residents and Engineer, maintenancetechnical personnel) may be involved in this audio-video evaluation.   All laws concerning confidentiality and patient access to medical records and copies of medical records apply to telemedicine.  The patient/ surrogate has received the [place] Notice of Privacy Practices.  I have reviewed this above verification and consent paragraph with the patient/ surrogate.  If the patient is not capacitated to understand the above, and no surrogate is available, since this is not an  emergency evaluation, the visit will be rescheduled until such time that the patient can consent, or the surrogate is available to consent.      Demographics:   Medical Record #: [id number]   Date: March 18, 2020   Patient Name: Danielle [name]   DOB: 23-Dec-1970  Age: 49 year old  Sex: female  Location: Home address on file      Evaluator(s):   Danielle RanCristina Danielle Ildefonso was evaluated by me today.    Clinic Location:  [facility]  [facility]  [phone] [facility] [place]  [place] [id number]      Chief Complaint   Patient presents with    Follow Up       SUBJECTIVE::Danielle Percell BostonMaria [name] is a 49 year old female here for follow-up. Problem list was updated and reviewed today with patient:    Persons present for visit: patient     Due to COVID-19 pandemic and a federally declared state of public health emergency, this service is being conducted via video.     Last visit was 05/2019   No acute concerns   Hasn't completed labs, etc because car trouble, pandemic     Hypertension, unspecified type      Mildly elevated today.  Not checking at home.  Asymptomatic.  Compliant with her meds -  lisinopril-hctz 20/12.5 2 tabs daily, atenolol 25 daily.  Check bmp, mg.   Strict return precautions given.    05/2019: bmp normal  Not checking bps  Compliant with meds            Relevant Orders    Magnesium, Blood Green Plasma Separator Tube    Hyperlipidemia, unspecified hyperlipidemia type - Primary  Nl 10/2018.  DM II.  Compliant with simvastatin 40 daily.  Started niacin in last few mos 2/2 chornic leg cramping and helping.  Sig improvement in diet last 3 months resulting in approx 30 lbs wt loss since nov 2019.   Not exercising.  -repeat FLP given fasting and having other labs done today.  -cont healthy diet  -start exc     -will repeat flp  -stopped her niacin b/c during pandemic started takin ga lot of supps so was trying to limit; but on simva 40 qhs          Relevant Medications     simvastatin (ZOCOR) 40 MG tablet    Other Relevant Orders    Lipid Panel Green Plasma Separator Tube     Fatty liver      Never completed abd [place] as concerned regarding COVID pandemic.  -cbc, cmp today  -encouraged to schedule  abd [place]     Never completed          Relevant Orders    CBC w/ Diff Lavender    Comprehensive Metabolic Panel Green        Endocrine    Type 2 diabetes mellitus without complication, with long-term current use of insulin (CMS-HCC)     A1C uptrending 6.8 04/2018-->7.3 10/2018.  Not checking Home BS.  metf 1000 BID, her novolin 70/30 from 50 units BID.  Sig improvement in diet last 3 mos, +wt loss, but no exc.  Never scheduled ophtho or podiatry appts.  -due for a1c, uacr  -CGM too expensive for pt; after A1C resulted will come up with better accucheck regimen for her   -given # to sched ophtho, podiatry appts   -cont healthy diet  -start exc  -Strict return precautions given.    Lab Results   Component Value Date    A1C 6.6 (H) 05/04/2019    A1C 7.3 (H) 11/13/2018    A1C 6.8 (H) 05/20/2018   not checking home BS  Tries to watch her diet   exc - went to back to work so not as sedentary , no specific exc regimen   metf 1000 BID, her novolin 70/30 from 50 units BID.  Not keeping track of weight   Never saw ophtho or podiatry               Intractable right heel pain      Sig improved but dull ache or numbness feeling. Monofilament testing normal. Never sched w/ podiatry, given # today. Strict return precautions given.    Has improved so will hold off on podiatry          Amenorrhea     Still has not had a period for at least 38yrs.  Never completed pelvic US or sched with gyn.  Encouraged sched pelvic US and then f/u with gyn, given # for clinic.  Strict return precautions given.    Never completed   Will hold off on gyn referral for now               ROS: As per HPI, o/w neg.     Patient Active Problem List    Diagnosis Date Noted    Encounter for screening for malignant  neoplasm of breast, unspecified screening modality 03/18/2020    Need for vaccination 10/19/2018    Routine health maintenance 05/20/2018    Hyperlipidemia, unspecified hyperlipidemia type 05/20/2018    Type 2 diabetes mellitus without complication, with long-term current use of  insulin (CMS-HCC) 05/20/2018    Hypertension, unspecified type 05/20/2018    Anxiety and depression 05/20/2018    Amenorrhea 05/20/2018    Fatty liver 05/20/2018    Obesity, unspecified classification, unspecified obesity type, unspecified whether serious comorbidity present 05/20/2018    Encounter for tobacco use cessation counseling 05/20/2018    Allergic rhinitis     Asthma     Chronic venous insufficiency     Skin tags, multiple acquired        Past medical history and family history reviewed and updated today as below.  Allergies and medications reviewed and updated as below.    Past Medical History:   Diagnosis Date    Allergic rhinitis     Amenorrhea 05/20/2018    Anxiety and depression     Asthma     Breast pain, left 10/19/2018    Chronic venous insufficiency     Encounter for tobacco use cessation counseling 05/20/2018    Fatty liver 05/20/2018    Hyperlipidemia, unspecified hyperlipidemia type 05/20/2018    Hypertension, unspecified type 05/20/2018    Intractable right heel pain 05/20/2018    Obesity, unspecified classification, unspecified obesity type, unspecified whether serious comorbidity present 05/20/2018    Routine health maintenance 05/20/2018    Skin tags, multiple acquired     Type 2 diabetes mellitus without complication, with long-term current use of insulin (CMS-HCC) 05/20/2018       Social History:  Social History     Tobacco Use    Smoking status: Light Tobacco Smoker     Years: 10.00    Smokeless tobacco: Never Used    Tobacco comment: About 1 pack a week, only when drinking alcohol    Substance Use Topics    Alcohol use: Yes     Frequency: 2-4 times a month    Drug use: Not Currently        Allergies:  No Known Allergies    Current Outpatient Medications   Medication Sig    albuterol 108 (90 Base) MCG/ACT inhaler Inhale 2 puffs by mouth every 6 hours as needed for Wheezing or Shortness of Breath.    atenolol (TENORMIN) 25 MG tablet Take 1 tablet (25 mg) by mouth daily.    buPROPion (WELLBUTRIN XL) 150 MG XL tablet Take 1 tablet (150 mg) by mouth daily.    Continuous Blood Gluc Receiver (CONTINUOUS BLOOD GLUCOSE, FREESTYLE LIBRE, 14 DAY READER) Use TID to read glucose topically.    Continuous Blood Gluc Sensor (CONTINUOUS BLOOD GLUCOSE, FREESTYLE LIBRE 14 DAY, 14-DAY SENSOR) Insert subcutaneously every 14 days.    famotidine (PEPCID) 40 MG tablet Take 1 tablet (40 mg) by mouth 2 times daily.    insulin NPH-insulin regular (HUMULIN 70/30) (70-30) 100 UNIT/ML injection Inject 50 Units under the skin 2 times daily.    lisinopril-hydrochlorothiazide (ZESTORETIC) 20-12.5 MG tablet Take 2 tablets by mouth daily.    MAGNESIUM GLYCINATE PLUS PO     metFORMIN (GLUCOPHAGE) 1000 MG tablet Take 1 tablet (1,000 mg) by mouth 2 times daily (with meals).    simvastatin (ZOCOR) 40 MG tablet Take 1 tablet (40 mg) by mouth every evening.     No current facility-administered medications for this visit.          OBJECTIVE::  General Appearance: healthy, alert, no distress, pleasant affect, cooperative.    Labs and chart reviewed.      A/P:  Problem List Items Addressed This Visit        Cardiovascular  Hyperlipidemia, unspecified hyperlipidemia type     Lab Results   Component Value Date    CHOL 180 05/04/2019    HDL 62 05/04/2019    LDLCALC 92 05/04/2019    TRIG 130 05/04/2019   The 10-year ASCVD risk score Denman George DC Jr., et al., 2013) is: 6%    Values used to calculate the score:      Age: 89 years      Sex: Female      Is Non-Hispanic African American: No      Diabetic: Yes      Tobacco smoker: Yes      Systolic Blood Pressure: 137 mmHg      Is BP treated: Yes      HDL Cholesterol: 62 mg/dL      Total  Cholesterol: 180 mg/dL  On simva 40 daily but stopped niacin 500 xl daily  Trying to work on diet  No exc regimen, but less sedentary  Not monitoring wt  -repeat FLP  -cont simva 40 daily   -diet/exc recs          Hypertension, unspecified type     Not checking at home  Compliant with atenolol 25 daily + lis/hctz 20/12.5 2 tabs daily  No sx  05/2019 bmp normal  -obtain bp monitor with arm cuff, log x 7 days and send via mychart  -refill meds  -repeat bmp, check mg  -Strict return precautions given.           Relevant Medications    lisinopril-hydrochlorothiazide (ZESTORETIC) 20-12.5 MG tablet    atenolol (TENORMIN) 25 MG tablet       GI and Hepatology    Fatty liver - Primary     05/2019 +transaminitis  Never completed abd [place]   -ck cbc, lfts  -reorder abd [place], # given to schedule          Relevant Orders    US Abdomen Complete       Endocrine    Type 2 diabetes mellitus without complication, with long-term current use of insulin (CMS-HCC)     Lab Results   Component Value Date    A1C 6.6 (H) 05/04/2019    A1C 7.3 (H) 11/13/2018    A1C 6.8 (H) 05/20/2018   At goal (<7)  Not checking home bs, cgm too expensive  Compliant with metformin 1000 bid, novolin 70/30 from 50 units BID.  Trying to eat healthy  No specific exc regimen, but less sedentary  Not monitoring wt   Never went to ophtho  heel pain improved, so never went to podiatrist  -check a1c, uacr, b12; depending on results, discuss frequency of bs monitoring   -refer ophtho given prior ref expire  -hold off on podiatry eval given heel pain improved  -cont meds   -diet/exc recs   -foot exam at f/u   -discuss rest at f/u   -Strict return precautions given.           Relevant Orders    CBC w/ Diff Lavender    Comprehensive Metabolic Panel    Glycosylated Hgb(A1C), Blood Lavender    Lipid Panel Green Plasma Separator Tube    TSH, Blood - See Instructions    Vitamin B12, Blood Green Plasma Separator Tube    Random Urine Microalb/Creat Ratio Panel    Ophthalmology Clinic        Behavioral Health    Anxiety and depression     Improving  Refill wellbutrin 150 xl daily  Strict return precautions given.           Relevant Medications    buPROPion (WELLBUTRIN XL) 150 MG XL tablet       Other    Amenorrhea     Still no menses x almost 4 yrs now  Never completed pelvic US or see gyn   -reorder pelvic US, #given to schedfule  -pt would like to hold off on gyn referral until [place] resulted  -Strict return precautions given.           Relevant Orders    US Pelvic Transabd/Transvag Combination    Obesity, unspecified classification, unspecified obesity type, unspecified whether serious comorbidity present     See "DM" and "HL"          Encounter for screening for malignant neoplasm of breast, unspecified screening modality    Relevant Orders    Screening Mammogram With Digital Breast Tomosynthesis - Bilateral            Return in about 3 months (around 06/17/2020) for physical .      Patient instructions:  See EPIC instructions.  Reviewed verbally and AVS available via MYchart for patient.      Patient Instructions   Complete your fasting labs within the next 1 month  Lab and Blood Drawing Services    Below is a list of [facility] locations where patients can have blood drawn for laboratory testing. Please note that some locations require appointments and cannot accommodate walk-in patients.      [facility] [place]   [facility]  [address]  [place], [place] [address]  [phone]  Monday-Friday: 7:30 a.m. to 5 p.m.  Sunday 7:30 a.m. to 10:30 a.m.   Closed on Saturdays     [facility] [place]   [facility] and [facility]  [address]  [place], [place] [address]  [phone]  Monday-Friday: 7:30 a.m. to 5 p.m.  *Patients are welcome to visit the lab in the main hospital:   [address]     [place] [facility]  [address] [place]   [place], [place]  [address]  [phone]  Monday-Friday: 8 a.m. to 4:30 p.m.  *Appointment Required     [place] [facility]  [address]  [place], [place] [address]  [phone]   Monday-Friday: 8:30 a.m. to 4:30 p.m.   *Appointment Required     [facility] - [address]  [address] Floor  [place], [place] [address]  [phone]   Monday-Friday: 8:30 a.m. to 4 p.m.   *Appointment Required     [place] [facility]  [address]  [place], [place] [address]  [phone]  Monday-Friday: 7:30 a.m. to 6 p.m.    [facility] [place]  [address]  [place], [place] [address]   [phone]  Monday-Friday: 8 a.m. - 8 p.m., Sat-Sun: 8 a.m. - 12 p.m.  *Last patient check-in at 7:30 p.m.      [facility] - [facility]  [address]   [address]  [place], [place] [address]  [phone]  Monday-Friday: 7:30 a.m. to 5  p.m.  *Appointment Required     [place] [facility]  [facility]  [address]  [place], [place] [address]  [phone]  Monday-Friday: 7:30 a.m. to 5 p.m.    [facility]  [address]  [address]  [place], [place] [address]  [phone]  Monday-Friday: 8 a.m. to 4:30 p.m.  Closed for lunch 12:30 to 1 p.m.     [place] [facility]  [address]  [place], [place] [address]  [phone]  Monday-Friday: 8 to 11:30 a.m. and 1 to 4 p.m.  Closed for lunch 11:30 a.m. to 1 p.m     [facility]   [address]  [place], [place] [address]  [phone]   Monday-Friday: 8:30 a.m. to 4 p.m.   *Appointment Required       Send me photos of your supplements (I would like to review ingredients)     Send me photos of your COVID vaccine card     Let me know what other medications you need refilled     Schedule your abdominal ultrasound and pelvic ultrasound: [phone]  Or  via mychart     The eye clinic will call you to schedule a diabetic eye exam     Please obtain a blood pressure monitor with an arm cuff. Check your blood pressure and heart rate three times per day for 7 days and then send it to me via Reino Kent, D.O., 03/18/2020 8:30 AM  Clinical Practice Organization  [facility]  [address], [place] [address]  [phone]  Fax: [phone]

## 2020-05-09 ENCOUNTER — Other Ambulatory Visit (INDEPENDENT_AMBULATORY_CARE_PROVIDER_SITE_OTHER): Payer: Self-pay | Admitting: Family Practice

## 2020-05-09 DIAGNOSIS — E119 Type 2 diabetes mellitus without complications: Secondary | ICD-10-CM

## 2020-05-09 DIAGNOSIS — E785 Hyperlipidemia, unspecified: Secondary | ICD-10-CM

## 2020-05-09 MED ORDER — SIMVASTATIN 40 MG OR TABS
ORAL_TABLET | ORAL | 3 refills | Status: AC
Start: 2020-05-09 — End: ?

## 2020-05-09 MED ORDER — METFORMIN HCL 1000 MG OR TABS
ORAL_TABLET | ORAL | 3 refills | Status: AC
Start: 2020-05-09 — End: ?

## 2020-05-09 NOTE — Telephone Encounter (Signed)
Refill request received from pharmacy     Medication requested:   Requested Prescriptions     Pending Prescriptions Disp Refills    metFORMIN (GLUCOPHAGE) 1000 MG tablet [Pharmacy Med Name: metFORMIN HCl 1000 MG Oral Tablet] 180 tablet 3     Sig: TAKE 1 TABLET BY MOUTH TWICE DAILY WITH MEALS    simvastatin (ZOCOR) 40 MG tablet [Pharmacy Med Name: Simvastatin 40 MG Oral Tablet] 90 tablet 3     Sig: Take 1 tablet by mouth in the evening       Patient is requesting refill of medication, please see orders for preloaded prescription refill.    Last visit with this MD:    03/18/2020  Next appointment with this MD:   Visit date not found    Last office visit:     03/18/2020  Next office visit:     Visit date not found    List of HM items due or overdue:  Health Maintenance Due   Topic Date Due    Breast Cancer Screen  Never done    Diabetic Retinal Exam  Never done    Cervical Cancer Screening  03/17/2019    Diabetic Foot Exam  05/19/2019    Diabetes A1c  11/03/2019    Diabetes Urine Microalbumin  11/14/2019    Tetanus (2 - Td or Tdap) 12/19/2019    LDL Monitoring  05/03/2020

## 2020-06-12 ENCOUNTER — Encounter (INDEPENDENT_AMBULATORY_CARE_PROVIDER_SITE_OTHER): Payer: Self-pay

## 2020-06-20 ENCOUNTER — Encounter (INDEPENDENT_AMBULATORY_CARE_PROVIDER_SITE_OTHER): Payer: Self-pay

## 2020-06-20 ENCOUNTER — Encounter (INDEPENDENT_AMBULATORY_CARE_PROVIDER_SITE_OTHER): Payer: Self-pay | Admitting: Family Practice

## 2020-06-24 ENCOUNTER — Other Ambulatory Visit (INDEPENDENT_AMBULATORY_CARE_PROVIDER_SITE_OTHER): Payer: Self-pay | Admitting: Family Practice

## 2020-06-24 DIAGNOSIS — F32A Depression, unspecified: Secondary | ICD-10-CM

## 2020-06-24 DIAGNOSIS — I10 Essential (primary) hypertension: Secondary | ICD-10-CM

## 2020-06-24 MED ORDER — LISINOPRIL-HYDROCHLOROTHIAZIDE 20-12.5 MG OR TABS
ORAL_TABLET | ORAL | 0 refills | Status: DC
Start: 2020-06-24 — End: 2020-09-23

## 2020-06-24 MED ORDER — ATENOLOL 25 MG OR TABS
ORAL_TABLET | ORAL | 0 refills | Status: DC
Start: 2020-06-24 — End: 2020-09-23

## 2020-06-24 MED ORDER — BUPROPION XL (DAILY) 150 MG OR TB24
ORAL_TABLET | ORAL | 0 refills | Status: DC
Start: 2020-06-24 — End: 2020-09-23

## 2020-06-24 NOTE — Telephone Encounter (Signed)
Refill request received from pharmacy     Medication requested:   Requested Prescriptions     Pending Prescriptions Disp Refills    buPROPion (WELLBUTRIN XL) 150 MG XL tablet [Pharmacy Med Name: buPROPion HCl ER (XL) 150 MG Oral Tablet Extended Release 24 Hour] 90 tablet 0     Sig: Take 1 tablet by mouth once daily    lisinopril-hydrochlorothiazide (ZESTORETIC) 20-12.5 MG tablet [Pharmacy Med Name: Lisinopril-hydroCHLOROthiazide 20-12.5 MG Oral Tablet] 180 tablet 0     Sig: Take 2 tablets by mouth once daily    atenolol (TENORMIN) 25 MG tablet [Pharmacy Med Name: Atenolol 25 MG Oral Tablet] 90 tablet 0     Sig: Take 1 tablet by mouth once daily       Patient is requesting refill of medication, please see orders for preloaded prescription refill.    Last visit with this MD:    03/18/2020  Next appointment with this MD:   Visit date not found    Last office visit:     03/18/2020  Next office visit:     Visit date not found    List of HM items due or overdue:  Health Maintenance Due   Topic Date Due    Breast Cancer Screen  Never done    Diabetic Retinal Exam  Never done    Cervical Cancer Screening  03/17/2019    Diabetic Foot Exam  05/19/2019    Diabetes A1c  11/03/2019    Diabetes Urine Microalbumin  11/14/2019    Tetanus (2 - Td or Tdap) 12/19/2019    LDL Monitoring  05/03/2020    PHQ9 Depression Monitoring doc flowsheet  07/18/2020

## 2020-07-25 ENCOUNTER — Encounter (INDEPENDENT_AMBULATORY_CARE_PROVIDER_SITE_OTHER): Payer: Self-pay | Admitting: Internal Medicine

## 2020-07-25 DIAGNOSIS — Z0000 Encounter for general adult medical examination without abnormal findings: Secondary | ICD-10-CM

## 2020-07-30 ENCOUNTER — Telehealth (INDEPENDENT_AMBULATORY_CARE_PROVIDER_SITE_OTHER): Payer: Self-pay | Admitting: Family Practice

## 2020-07-30 ENCOUNTER — Encounter (INDEPENDENT_AMBULATORY_CARE_PROVIDER_SITE_OTHER): Payer: Self-pay | Admitting: Family Practice

## 2020-07-30 NOTE — Telephone Encounter (Signed)
Fax Received    Date Received: 07/29/2020    Date Processed: 07/30/20 10:31 AM    From: [employer] [facility]    Document Type: Prior Authorization     Document on 07/30/2020 10:32 AM by Elder Negus: [employer]mn[employer]ASH - PAR       Outside fax imported to patient's media manager and attached via link at bottom of this encounter, please review.    Thank you

## 2020-07-30 NOTE — Telephone Encounter (Signed)
Error

## 2020-07-30 NOTE — Telephone Encounter (Signed)
Please review and sign paperwork in your inbox.

## 2020-07-31 NOTE — Telephone Encounter (Signed)
Left message to speak with pt so we can go over form regarding Omnipod.  For back in md inbox

## 2020-08-07 NOTE — Telephone Encounter (Signed)
Fax Received    Date Received: 08/06/2020    Date Processed: 08/07/20 2:24 PM    From: [employer] [facility]    Document Type: Prior Authorization     Document on 08/07/2020 2:23 PM by Elder Negus: Omn[employer]od DASH - PAR - HCP Unresponsive Notification       Outside fax imported to patient's media manager and attached via link at bottom of this encounter, please review.    Thank you

## 2020-08-07 NOTE — Telephone Encounter (Signed)
previuos message is created. Close this one out

## 2020-08-14 NOTE — Telephone Encounter (Addendum)
Called pt, who stated this is an insulin pump   Pt stating she is currently using 45U qam & 35U qpm   Stating she has never had this prescribed before, saw a commercial & just wanted to know if this was covered by her insurance.   Pt would like to know if she would be a good candidate for this.   Stating she is more so interested in CGM   Stating CGM was previously rx'd, but out of pocket cost was too high   Please advise.    Thank You,  Alvino Chapel Assistant]        Form placed in "Provider WIP folder"

## 2020-08-16 NOTE — Telephone Encounter (Addendum)
Called pt and left message for call back     Thank You,  Chanah Tidmore G.  [Medical Assistant]

## 2020-08-19 NOTE — Telephone Encounter (Signed)
2nd attempt -   Called pt and left message for call back     Thank You,  Lyndell Gillyard G.  [Medical Assistant]

## 2020-08-21 ENCOUNTER — Telehealth (INDEPENDENT_AMBULATORY_CARE_PROVIDER_SITE_OTHER): Payer: Self-pay | Admitting: Family Practice

## 2020-08-21 NOTE — Telephone Encounter (Signed)
Printed & placed in pcp inbox

## 2020-08-21 NOTE — Telephone Encounter (Signed)
Fax Received    Date Received: 08/20/2020    Date Processed: 08/21/20 10:47 AM    From: Pharmacy     Document Type: Physician Orders     Document on 08/21/2020 10:49 AM by Elder Negus: [facility] - Dexcom Order Request       Outside fax imported to patient's media manager and attached via link at bottom of this encounter, please review.    Thank you

## 2020-08-22 NOTE — Telephone Encounter (Signed)
Signed by Dr Borad, faxed back, received confirmation & scanned to medical records to be uploaded.    -Danielle [name] G.  [Medical Assistant]

## 2020-08-28 NOTE — Telephone Encounter (Signed)
Please see telephone encounter ------> Detailed Report     Thank You,  Lashelle Koy G.  [Medical Assistant]

## 2020-09-21 ENCOUNTER — Telehealth (INDEPENDENT_AMBULATORY_CARE_PROVIDER_SITE_OTHER): Payer: Self-pay | Admitting: Family Practice

## 2020-09-21 DIAGNOSIS — I10 Essential (primary) hypertension: Secondary | ICD-10-CM

## 2020-09-21 DIAGNOSIS — F32A Depression, unspecified: Secondary | ICD-10-CM

## 2020-09-21 DIAGNOSIS — Z0000 Encounter for general adult medical examination without abnormal findings: Secondary | ICD-10-CM

## 2020-09-23 ENCOUNTER — Encounter: Payer: Self-pay | Admitting: Family Practice

## 2020-09-23 DIAGNOSIS — E119 Type 2 diabetes mellitus without complications: Secondary | ICD-10-CM

## 2020-09-23 NOTE — Telephone Encounter (Addendum)
Cheryll Cockayne, CPhT  (Rx Refill and [facility])      Dopamine Reuptake Inhibitor Refill Protocol    Last visit in enc specialty: 03/18/2020     Recent Visits in This Encounter Department     Date Provider Department Visit Type Primary Dx    03/18/2020 Aurelio Brash, DO [facility] Via [facility] Telemedicine Fatty liver    05/04/2019 Aurelio Brash, DO [place] [facility] Office Visit Hyperlipidemia, unspecified hyperlipidemia type    10/19/2018 Aurelio Brash, DO [place] [facility] Office Visit Hypertension, unspecified type    05/20/2018 Aurelio Brash, DO [place] [facility] Visit Routine health maintenance         Next f/u appt due:  Return in about 3 months (around 06/17/2020) for physical .  Next appt in enc specialty: Visit date not found      Future Appointments 09/23/2020 - 09/22/2025    None        Per OV  03/18/20  A/P-Anxiety and depression  Improving  Refill wellbutrin 150 xl daily   Strict return precautions given.  buPROPion (WELLBUTRIN XL) 150 MG XL tablet      LABS required:  (None)    Monitoring required:  (Q year BP, HR)     (BP range: Systolic=90-150  FAOZHYQMV=78-46)  Blood Pressure   05/04/19 137/84   10/19/18 117/69   05/20/18 158/90       (HR range: 55-110)  Pulse Readings from Last 3 Encounters:   05/04/19 71   10/19/18 77   05/20/18 67       Beta Blocker Refill Protocol    Per OV  03/18/20  HPI-Hypertension, unspecified type  Mildly elevated today.  Not checking at home.  Asymptomatic.  Compliant with her meds -lisinopril-hctz 20/12.5 2 tabs daily, atenolol 25 daily.  Check bmp, mg.  Strict return precautions given.  A/P-Hypertension, unspecified type  Not checking at home  Compliant with atenolol 25 daily + lis/hctz 20/12.5 2 tabs daily  No sx  05/2019 bmp normal  -obtain bp monitor with arm cuff, log x 7 days and send via mychart  -refill meds  -repeat bmp, check mg  -Strict return precautions  given      LABS required:  (None)    Monitoring required:  (Q year BP, HR)    (BP range: Systolic=90-150  NGEXBMWUX=32-44)  Blood Pressure   05/04/19 137/84   10/19/18 117/69   05/20/18 158/90       (HR range: 55-110)  Pulse Readings from Last 3 Encounters:   05/04/19 71   10/19/18 77   05/20/18 67       ACE Inhibitor with Diuretic Refill Protocol      LABS required:  (Q year Sodium, Potassium, Creatinine)    Lab Results   Component Value Date    NA 144 05/04/2019    K 4.4 05/04/2019    CL 101 05/04/2019    BICARB 30 (H) 05/04/2019    BUN 14 05/04/2019    CREAT 0.81 05/04/2019       Lab Results   Component Value Date    GFRNON >60 05/04/2019         Monitoring required:  (Q year BP) (pregnancy prn)  *If pt is pregnant, REFER TO MD*    (BP range: Systolic=90-150  WNUUVOZDG=64-40)  Blood Pressure   05/04/19 137/84   10/19/18 117/69   05/20/18 158/90  Pulse Readings from Last 3 Encounters:   05/04/19 71   10/19/18 77   05/20/18 67     -----------------------------------------------------------------------------------------------------------    The above technician note was evaluated by the pharmacist.  Pharmacist Assessment and Plan:     wellbutrin 150 mg    90 + 1 RF but will send schedule request for f/u   Atenolol 25 mg    90 + 1 RF    Lisinopril hctz    90 + 0 RF - CMP    Macon Large, PharmD  [facility]   Refill and Prior Auth Clinical Services  Phone:  [phone]  Ext:  [phone]

## 2020-09-24 MED ORDER — LISINOPRIL-HYDROCHLOROTHIAZIDE 20-12.5 MG OR TABS
2.00 | ORAL_TABLET | Freq: Every day | ORAL | 0 refills | Status: DC
Start: 2020-09-24 — End: 2020-12-10

## 2020-09-24 MED ORDER — BUPROPION XL (DAILY) 150 MG OR TB24
150.00 mg | ORAL_TABLET | Freq: Every day | ORAL | 1 refills | Status: AC
Start: 2020-09-24 — End: ?

## 2020-09-24 MED ORDER — ATENOLOL 25 MG OR TABS
25.00 mg | ORAL_TABLET | Freq: Every day | ORAL | 1 refills | Status: AC
Start: 2020-09-24 — End: ?

## 2020-09-24 NOTE — Telephone Encounter (Signed)
Scheduling Request:   [facility]     Please remind pt to schedule f/u appt w Dr Denman George, [place], for Return in about 3 months for physical ., was due on or after 06/17/20.     Authorized 90 days supply + 0 RF until f/u scheduled.         Thanks,    [facility]   Refill and Prior [employer]  Phone:  [phone]  Ext:  [phone]

## 2020-09-25 ENCOUNTER — Encounter (INDEPENDENT_AMBULATORY_CARE_PROVIDER_SITE_OTHER): Payer: Self-pay | Admitting: Hospital

## 2020-09-25 NOTE — Telephone Encounter (Signed)
Called pt and left a VM to remind her to schedule her Annual Physical with Dr. Denman George. Provided [phone] or she can schedule through mychart.

## 2020-10-14 ENCOUNTER — Telehealth (INDEPENDENT_AMBULATORY_CARE_PROVIDER_SITE_OTHER): Payer: Self-pay | Admitting: Family Practice

## 2020-10-14 NOTE — Telephone Encounter (Signed)
Fax Received    Date Received: 10/11/2020    Date Processed: 10/14/20 2:01 PM    From: Pharmacy     Document Type: [facility] - Prescription Clarification      Document on 10/14/2020 2:02 PM by Elder Negus: [facility] - Prescription Clarification       Outside fax imported to patient's media manager and attached via link at bottom of this encounter, please review.    Thank you

## 2020-10-23 ENCOUNTER — Telehealth (INDEPENDENT_AMBULATORY_CARE_PROVIDER_SITE_OTHER): Payer: Self-pay | Admitting: Family Practice

## 2020-10-23 NOTE — Telephone Encounter (Signed)
Per Dr Borad, document to be scanned & placed in shred bin    -Linnaea Ahn G.  [Medical Assistant]

## 2020-11-01 ENCOUNTER — Other Ambulatory Visit: Payer: Self-pay

## 2020-11-01 NOTE — Telephone Encounter (Signed)
[facility] Diabetes Health Maintenance and Care Gaps outreach conducted by nursing student via telephone encounter. Nursing student note reviewed and agree with student's documentation to be co-signed. PCP and clinic notified via messaging of impending 11/30/20 insurance change to [place].  No further action required.

## 2020-11-01 NOTE — Telephone Encounter (Signed)
Diabetes Health Maintenance and Care Gaps Outreach   Population Health Services Organization     The [facility] Nursing Student has contacted Danielle [name] a 49 year old female for Diabetes Health Maintenance and Care Gaps outreach.     Patient was identified from an Epic generated Care Gaps report.    Telephonic call placed to patient and had discussion regarding outstanding Diabetes health maintenance topics. Discussed the following care gaps and patient declining to address and complete recommended Health Maintenance topics at this time. Explained to patient the importance of completing these Health Maintenance topics and advised to contact their PCP for further follow up questions.     Patient is moving to [place] and will no longer be receiving care through [facility]. Patient is aware of care gaps and will have them addressed with new provider.    Routing to [employer] to review.

## 2020-12-09 ENCOUNTER — Other Ambulatory Visit (INDEPENDENT_AMBULATORY_CARE_PROVIDER_SITE_OTHER): Payer: Self-pay | Admitting: Family Practice

## 2020-12-09 DIAGNOSIS — I10 Essential (primary) hypertension: Secondary | ICD-10-CM

## 2020-12-10 MED ORDER — LISINOPRIL-HYDROCHLOROTHIAZIDE 20-12.5 MG OR TABS
2.00 | ORAL_TABLET | Freq: Every day | ORAL | 0 refills | Status: AC
Start: 2020-12-10 — End: ?

## 2020-12-10 NOTE — Telephone Encounter (Addendum)
Subsequent Refill Request:     At last request, the pt was provided:    90 days supply + 0 RF      and instructed to:     Schedule Follow Up Appt      Was pt successfully contacted by the office?      Yes:  Office spoke to pt OR left voicemail      Has the pt completed the requested action?       No -  Appt NOT scheduled:   Pend 30ds + 0RF and route      Last visit in enc specialty: 03/18/2020     Recent Visits in This Encounter Department     Date Provider Department Visit Type Primary Dx    03/18/2020 Aurelio Brash, DO [place] [facility] Telemedicine Fatty liver    05/04/2019 Aurelio Brash, DO [place] [facility] Office Visit Hyperlipidemia, unspecified hyperlipidemia type    10/19/2018 Aurelio Brash, DO [place] [facility] Office Visit Hypertension, unspecified type    05/20/2018 Aurelio Brash, DO [facility] [facility] Visit Routine health maintenance           Next appt in enc specialty: Visit date not found     Future Appointments 12/10/2020 - 12/09/2025    None             Paste FINAL communication to/from pt, below (ONLY if not in last encounter):  ^^^^^^^^^^^^^^^^^^^^^^^^^^^^^^^^^^^^^^^^^^^^^^^^^^^^^^^^^^^^^^^^^^^^^^^^^^^^^^  Per MyChart/Tele note on:      -----------------------------------------------------------------------------------------------------------    The above technician note was evaluated by the pharmacist.  Pharmacist Assessment and Plan:     Per 11/01/20 [facility] encounter, pt's insurance is switching to [place] as of Jan 1.    Will auth 30/0 to bridge gap. Pt was due for physical 06/17/20.    Blood Pressure   05/04/19 137/84   10/19/18 117/69   05/20/18 158/90     Lab Results   Component Value Date    NA 144 05/04/2019    K 4.4 05/04/2019    CL 101 05/04/2019    BICARB 30 (H) 05/04/2019    BUN 14 05/04/2019    CREAT 0.81 05/04/2019    GLU 128 (H) 05/04/2019    CA 9.7 05/04/2019         Quenten Raven, PharmD, BCACP,  CDE  [facility]   Refill and Prior Auth Clinical Services  Phone:  [phone]  Ext:  [phone]

## 2023-05-25 ENCOUNTER — Ambulatory Visit (INDEPENDENT_AMBULATORY_CARE_PROVIDER_SITE_OTHER): Payer: Self-pay

## 2024-12-12 ENCOUNTER — Telehealth (INDEPENDENT_AMBULATORY_CARE_PROVIDER_SITE_OTHER): Payer: Self-pay | Admitting: Family Practice

## 2024-12-12 NOTE — Telephone Encounter (Signed)
 [facility] Encounter Note    Previous Telephone/MyChart encounter created for same issue?  No    Please describe the action taken by yourself in detail: Pt had questions regarding a Mychart message . Also wanted to make sure her insurance was updated.     This is just documentation in the patient chart, no further action is required.    My phone extension, if further information is needed: [phone]    Rosaline Sprung

## 2024-12-13 ENCOUNTER — Encounter (INDEPENDENT_AMBULATORY_CARE_PROVIDER_SITE_OTHER): Payer: Self-pay | Admitting: Family Practice

## 2025-01-23 ENCOUNTER — Encounter (INDEPENDENT_AMBULATORY_CARE_PROVIDER_SITE_OTHER): Admitting: Family Practice
